# Patient Record
Sex: Female | Born: 1950 | Race: White | Hispanic: No | Marital: Married | Smoking: Never smoker
Health system: Southern US, Community
[De-identification: ages and names within clinical notes are randomized; demographics above are authoritative.]

## PROBLEM LIST (undated history)

## (undated) DIAGNOSIS — M1990 Unspecified osteoarthritis, unspecified site: Secondary | ICD-10-CM

## (undated) DIAGNOSIS — E785 Hyperlipidemia, unspecified: Secondary | ICD-10-CM

## (undated) HISTORY — DX: Unspecified osteoarthritis, unspecified site: M19.90

## (undated) HISTORY — DX: Hyperlipidemia, unspecified: E78.5

---

## 2013-07-12 LAB — HM PAP SMEAR: HM Pap smear: NEGATIVE

## 2015-02-27 LAB — HEPATIC FUNCTION PANEL
ALT: 20 U/L (ref 7–35)
AST: 19 U/L (ref 13–35)
Alkaline Phosphatase: 53 U/L (ref 25–125)
BILIRUBIN, TOTAL: 0.3 mg/dL

## 2015-02-27 LAB — BASIC METABOLIC PANEL
BUN: 15 mg/dL (ref 4–21)
Creatinine: 1.1 mg/dL (ref 0.5–1.1)
GLUCOSE: 88 mg/dL
POTASSIUM: 4.3 mmol/L (ref 3.4–5.3)
Sodium: 139 mmol/L (ref 137–147)

## 2015-02-27 LAB — LIPID PANEL
CHOLESTEROL: 224 mg/dL — AB (ref 0–200)
HDL: 55 mg/dL (ref 35–70)
LDL CALC: 140 mg/dL
TRIGLYCERIDES: 147 mg/dL (ref 40–160)

## 2015-02-27 LAB — TSH: TSH: 4.05 u[IU]/mL (ref 0.41–5.90)

## 2015-05-24 ENCOUNTER — Encounter: Payer: Self-pay | Admitting: Physician Assistant

## 2015-05-24 ENCOUNTER — Ambulatory Visit (INDEPENDENT_AMBULATORY_CARE_PROVIDER_SITE_OTHER): Payer: Medicare Other

## 2015-05-24 ENCOUNTER — Ambulatory Visit (INDEPENDENT_AMBULATORY_CARE_PROVIDER_SITE_OTHER): Payer: Medicare Other | Admitting: Physician Assistant

## 2015-05-24 VITALS — BP 116/70 | HR 86 | Wt 142.0 lb

## 2015-05-24 DIAGNOSIS — Z780 Asymptomatic menopausal state: Secondary | ICD-10-CM

## 2015-05-24 DIAGNOSIS — M85851 Other specified disorders of bone density and structure, right thigh: Secondary | ICD-10-CM | POA: Diagnosis not present

## 2015-05-24 DIAGNOSIS — Z13820 Encounter for screening for osteoporosis: Secondary | ICD-10-CM

## 2015-05-24 DIAGNOSIS — G43001 Migraine without aura, not intractable, with status migrainosus: Secondary | ICD-10-CM

## 2015-05-24 DIAGNOSIS — F32A Depression, unspecified: Secondary | ICD-10-CM

## 2015-05-24 DIAGNOSIS — E782 Mixed hyperlipidemia: Secondary | ICD-10-CM | POA: Insufficient documentation

## 2015-05-24 DIAGNOSIS — M51369 Other intervertebral disc degeneration, lumbar region without mention of lumbar back pain or lower extremity pain: Secondary | ICD-10-CM | POA: Insufficient documentation

## 2015-05-24 DIAGNOSIS — Z1231 Encounter for screening mammogram for malignant neoplasm of breast: Secondary | ICD-10-CM

## 2015-05-24 DIAGNOSIS — M818 Other osteoporosis without current pathological fracture: Secondary | ICD-10-CM | POA: Diagnosis not present

## 2015-05-24 DIAGNOSIS — M5136 Other intervertebral disc degeneration, lumbar region: Secondary | ICD-10-CM

## 2015-05-24 DIAGNOSIS — E785 Hyperlipidemia, unspecified: Secondary | ICD-10-CM

## 2015-05-24 DIAGNOSIS — F329 Major depressive disorder, single episode, unspecified: Secondary | ICD-10-CM | POA: Diagnosis not present

## 2015-05-24 DIAGNOSIS — Z8249 Family history of ischemic heart disease and other diseases of the circulatory system: Secondary | ICD-10-CM | POA: Insufficient documentation

## 2015-05-24 DIAGNOSIS — M810 Age-related osteoporosis without current pathological fracture: Secondary | ICD-10-CM | POA: Insufficient documentation

## 2015-05-24 NOTE — Patient Instructions (Signed)
Encouraged to start ASA 81mg  daily.  Encouraged vitamin D and 1500mg  of calcium daily.

## 2015-05-24 NOTE — Progress Notes (Signed)
   Subjective:    Patient ID: Kristen Morris, female    DOB: [birth year], 65 y.o.   MRN: [id number]  HPI Pt is a 65 yo female who presents to the clinic to establish care.   .. Active Ambulatory Problems    Diagnosis Date Noted  . Depression 05/24/2015  . DDD (degenerative disc disease), lumbar 05/24/2015  . Hyperlipidemia 05/24/2015  . Migraine without aura and with status migrainosus, not intractable 05/24/2015  . Post-menopausal 05/24/2015   Resolved Ambulatory Problems    Diagnosis Date Noted  . No Resolved Ambulatory Problems   No Additional Past Medical History   .[name]. Family History  Problem Relation Age of Onset  . Heart attack Father   . Cancer Brother    .[name]. Social History   Social History  . Marital Status: Married    Spouse Name: N/A  . Number of Children: N/A  . Years of Education: N/A   Occupational History  . Not on file.   Social History Main Topics  . Smoking status: Never Smoker   . Smokeless tobacco: Not on file  . Alcohol Use: No  . Drug Use: No  . Sexual Activity: Yes   Other Topics Concern  . Not on file   Social History Narrative  . No narrative on file   She does not need refills. She has never had colonoscopy. She had last labs done in [facility].    Review of Systems  All other systems reviewed and are negative.      Objective:   Physical Exam  Constitutional: She is oriented to person, place, and time. She appears well-developed and well-nourished.  HENT:  Head: Normocephalic and atraumatic.  Cardiovascular: Normal rate, regular rhythm and normal heart sounds.   Pulmonary/Chest: Effort normal and breath sounds normal.  Neurological: She is alert and oriented to person, place, and time.  Skin: Skin is dry.  Psychiatric: She has a normal mood and affect. Her behavior is normal.          Assessment & Plan:  Hyperlipidemia- on lovastatin. Will recheck in summer.   Depression- controlled on celexa. Interested in tapering  down. Ok to cut in half daily. At least stay on for 6 weeks before tapering any more. Encouraged to take at bedtime.   Lumbar DDD- controlled on diclofenac. Encouraged exercise.   Migraine- controlled with verapamil.   Post-menopausal- DEXA and mammogram ordered for screening purposes.   cologuard ordered. Pt declined colonoscopy.   Pt needs medicare wellness visit. She will schedule for the summer.

## 2015-06-01 ENCOUNTER — Other Ambulatory Visit: Payer: Self-pay | Admitting: *Deleted

## 2015-06-01 MED ORDER — CITALOPRAM HYDROBROMIDE 20 MG PO TABS: 10.0000 mg | ORAL_TABLET | Freq: Every day | ORAL | Status: DC

## 2015-06-21 DIAGNOSIS — R030 Elevated blood-pressure reading, without diagnosis of hypertension: Secondary | ICD-10-CM | POA: Diagnosis not present

## 2015-06-21 DIAGNOSIS — L237 Allergic contact dermatitis due to plants, except food: Secondary | ICD-10-CM | POA: Diagnosis not present

## 2015-06-30 ENCOUNTER — Encounter: Payer: Self-pay | Admitting: Physician Assistant

## 2015-07-19 ENCOUNTER — Encounter: Payer: Self-pay | Admitting: Physician Assistant

## 2015-08-23 ENCOUNTER — Ambulatory Visit (INDEPENDENT_AMBULATORY_CARE_PROVIDER_SITE_OTHER): Payer: Medicare Other | Admitting: Physician Assistant

## 2015-08-23 ENCOUNTER — Encounter: Payer: Self-pay | Admitting: Physician Assistant

## 2015-08-23 ENCOUNTER — Telehealth: Payer: Self-pay | Admitting: Physician Assistant

## 2015-08-23 VITALS — BP 118/78 | HR 84 | Wt 145.0 lb

## 2015-08-23 DIAGNOSIS — M810 Age-related osteoporosis without current pathological fracture: Secondary | ICD-10-CM | POA: Diagnosis not present

## 2015-08-23 DIAGNOSIS — Z0000 Encounter for general adult medical examination without abnormal findings: Secondary | ICD-10-CM | POA: Diagnosis not present

## 2015-08-23 DIAGNOSIS — R946 Abnormal results of thyroid function studies: Secondary | ICD-10-CM | POA: Diagnosis not present

## 2015-08-23 DIAGNOSIS — R7989 Other specified abnormal findings of blood chemistry: Secondary | ICD-10-CM

## 2015-08-23 DIAGNOSIS — E785 Hyperlipidemia, unspecified: Secondary | ICD-10-CM | POA: Diagnosis not present

## 2015-08-23 DIAGNOSIS — Z8349 Family history of other endocrine, nutritional and metabolic diseases: Secondary | ICD-10-CM

## 2015-08-23 DIAGNOSIS — Z131 Encounter for screening for diabetes mellitus: Secondary | ICD-10-CM

## 2015-08-23 DIAGNOSIS — Z1159 Encounter for screening for other viral diseases: Secondary | ICD-10-CM

## 2015-08-23 LAB — LIPID PANEL
CHOLESTEROL: 204 mg/dL — AB (ref 125–200)
HDL: 56 mg/dL (ref >=46)
LDL CALC: 119 mg/dL (ref <130)
TRIGLYCERIDES: 143 mg/dL (ref <150)
Total CHOL/HDL Ratio: 3.6 Ratio (ref <=5.0)
VLDL: 29 mg/dL (ref <30)

## 2015-08-23 LAB — COMPLETE METABOLIC PANEL WITH GFR
ALBUMIN: 4.2 g/dL (ref 3.6–5.1)
ALK PHOS: 48 U/L (ref 33–130)
ALT: 31 U/L — AB (ref 6–29)
AST: 31 U/L (ref 10–35)
BILIRUBIN TOTAL: 0.4 mg/dL (ref 0.2–1.2)
BUN: 19 mg/dL (ref 7–25)
CO2: 25 mmol/L (ref 20–31)
CREATININE: 1.02 mg/dL — AB (ref 0.50–0.99)
Calcium: 9.4 mg/dL (ref 8.6–10.4)
Chloride: 106 mmol/L (ref 98–110)
GFR, EST AFRICAN AMERICAN: 67 mL/min (ref >=60)
GFR, Est Non African American: 58 mL/min — ABNORMAL LOW (ref >=60)
Glucose, Bld: 87 mg/dL (ref 65–99)
Potassium: 4.5 mmol/L (ref 3.5–5.3)
Sodium: 141 mmol/L (ref 135–146)
TOTAL PROTEIN: 6.5 g/dL (ref 6.1–8.1)

## 2015-08-23 LAB — TSH: TSH: 2.8 mIU/L

## 2015-08-23 MED ORDER — CITALOPRAM HYDROBROMIDE 20 MG PO TABS: 20.0000 mg | ORAL_TABLET | Freq: Every day | ORAL | 4 refills | Status: DC

## 2015-08-23 MED ORDER — VERAPAMIL HCL 80 MG PO TABS: 80.0000 mg | ORAL_TABLET | Freq: Two times a day (BID) | ORAL | 4 refills | Status: DC

## 2015-08-23 NOTE — Progress Notes (Signed)
Subjective:    Kristen Morris is a 65 y.o. female who presents for Medicare Initial preventive examination.  Preventive Screening-Counseling & Management  Tobacco History  Smoking Status  . Never Smoker  Smokeless Tobacco  . Not on file     Problems Prior to Visit 1.   Current Problems (verified) Patient Active Problem List   Diagnosis Date Noted  . Depression 05/24/2015  . DDD (degenerative disc disease), lumbar 05/24/2015  . Hyperlipidemia 05/24/2015  . Migraine without aura and with status migrainosus, not intractable 05/24/2015  . Post-menopausal 05/24/2015  . Family history of heart attack 05/24/2015  . Osteoporosis 05/24/2015    Medications Prior to Visit Current Outpatient Prescriptions on File Prior to Visit  Medication Sig Dispense Refill  . diclofenac (VOLTAREN) 75 MG EC tablet Take 75 mg by mouth 2 (two) times daily.    [name] lovastatin (MEVACOR) 20 MG tablet Take 20 mg by mouth at bedtime.     No current facility-administered medications on file prior to visit.     Current Medications (verified) Current Outpatient Prescriptions  Medication Sig Dispense Refill  . citalopram (CELEXA) 20 MG tablet Take 1 tablet (20 mg total) by mouth daily. 90 tablet 4  . diclofenac (VOLTAREN) 75 MG EC tablet Take 75 mg by mouth 2 (two) times daily.    [name] lovastatin (MEVACOR) 20 MG tablet Take 20 mg by mouth at bedtime.    . verapamil (CALAN) 80 MG tablet Take 1 tablet (80 mg total) by mouth 2 (two) times daily. 180 tablet 4   No current facility-administered medications for this visit.      Allergies (verified) Review of patient's allergies indicates no known allergies.   PAST HISTORY  Family History Family History  Problem Relation Age of Onset  . Heart attack Father   . Cancer Brother     Social History Social History  Substance Use Topics  . Smoking status: Never Smoker  . Smokeless tobacco: Not on file  . Alcohol use No     Are there smokers in your home  (other than you)? No  Risk Factors Current exercise habits: Gym/ health club routine includes low impact aerobics, stair stepper , stationary bike, treadmill and walking on track .  Dietary issues discussed: NONE    Cardiac risk factors: advanced age (older than 58 for men, 27 for women), dyslipidemia and family history of premature cardiovascular disease.  Depression Screen (Note: if answer to either of the following is "Yes", a more complete depression screening is indicated)   Over the past 2 weeks, have you felt down, depressed or hopeless? No  Over the past 2 weeks, have you felt little interest or pleasure in doing things? No  Have you lost interest or pleasure in daily life? No  Do you often feel hopeless? No  Do you cry easily over simple problems? No  Activities of Daily Living In your present state of health, do you have any difficulty performing the following activities?:  Driving? No Managing money?  No Feeding yourself? No Getting from bed to chair? No Climbing a flight of stairs? No Preparing food and eating?: No Bathing or showering? No Getting dressed: No Getting to the toilet? No Using the toilet:No Moving around from place to place: No In the past year have you fallen or had a near fall?:No   Are you sexually active?  Yes  Do you have more than one partner?  No  Hearing Difficulties: No Do you often ask people  to speak up or repeat themselves? No Do you experience ringing or noises in your ears? No Do you have difficulty understanding soft or whispered voices? No   Do you feel that you have a problem with memory? No  Do you often misplace items? No  Do you feel safe at home?  Yes  Cognitive Testing  Alert? Yes  Normal Appearance?Yes  Oriented to person? Yes  Place? Yes   Time? Yes  Recall of three objects?  Yes  Can perform simple calculations? Yes  Displays appropriate judgment?Yes  Can read the correct time from a watch face?Yes   Advanced  Directives have been discussed with the patient? Yes  List the Names of Other Physician/Practitioners you currently use: 1.    Indicate any recent Medical Services you may have received from other than [facility] providers in the past year (date may be approximate).   There is no immunization history on file for this patient.  Screening Tests Health Maintenance  Topic Date Due  . Hepatitis C Screening  05-08-1950  . TETANUS/TDAP  04/20/1969  . Fecal DNA (Cologuard)  04/20/2000  . PNA vac Low Risk Adult (1 of 2 - PCV13) 04/21/2015  . INFLUENZA VACCINE  08/08/2015  . HIV Screening  08/22/2016 (Originally 04/20/1965)  . ZOSTAVAX  08/22/2025 (Originally 04/21/2010)  . MAMMOGRAM  05/23/2016  . PAP SMEAR  07/12/2016  . DEXA SCAN  05/23/2017    All answers were reviewed with the patient and necessary referrals were made:  Iran Planas, PA-C   08/23/2015   History reviewed: allergies, current medications, past family history, past medical history, past social history, past surgical history and problem list  Review of Systems A comprehensive review of systems was negative.    Objective:     Vision by Snellen chart: right eye:pt had eye exam in 02/2015. , left eye:pt had eye exam 02/2015  There is no height or weight on file to calculate BMI. BP 118/78   Pulse 84   Wt 145 lb (65.8 kg)   BP 118/78   Pulse 84   Wt 145 lb (65.8 kg)   General Appearance:    Alert, cooperative, no distress, appears stated age  Head:    Normocephalic, without obvious abnormality, atraumatic  Eyes:    PERRL, conjunctiva/corneas clear, EOM's intact, fundi    benign, both eyes  Ears:    Normal TM's and external ear canals, both ears  Nose:   Nares normal, septum midline, mucosa normal, no drainage    or sinus tenderness  Throat:   Lips, mucosa, and tongue normal; teeth and gums normal  Neck:   Supple, symmetrical, trachea midline, no adenopathy;    thyroid:  no enlargement/tenderness/nodules; no carotid    bruit or JVD  Back:     Symmetric, no curvature, ROM normal, no CVA tenderness  Lungs:     Clear to auscultation bilaterally, respirations unlabored  Chest Wall:    No tenderness or deformity   Heart:    Regular rate and rhythm, S1 and S2 normal, no murmur, rub   or gallop  Breast Exam:    Not done.   Abdomen:     Soft, non-tender, bowel sounds active all four quadrants,    no masses, no organomegaly  Genitalia:  NOt done.   Rectal:  Not done.   Extremities:   Extremities normal, atraumatic, no cyanosis or edema  Pulses:   2+ and symmetric all extremities  Skin:   Skin color, texture,  turgor normal, no rashes or lesions  Lymph nodes:   Cervical, supraclavicular, and axillary nodes normal  Neurologic:   CNII-XII intact, normal strength, sensation and reflexes    throughout       Assessment:     Healthy Woman Exam     Plan:     During the course of the visit the patient was educated and counseled about appropriate screening and preventive services including:    Pneumococcal vaccine   Td vaccine  Colorectal cancer screening     Pt wanted information on pneumonia vaccines. Was given today.  Tdap given today.  Will resend cologuard information since patient did not receive kit in mail.  Pt declined zostavax.   cmp ordered.   Osteoporosis- discussed prolia. Will check with insurance to get approved.   Hyperlipidemia- lipid level ordered.     Patient Instructions (the written plan) was given to the patient.  Medicare Attestation I have personally reviewed: The patient's medical and social history Their use of alcohol, tobacco or illicit drugs Their current medications and supplements The patient's functional ability including ADLs,fall risks, home safety risks, cognitive, and hearing and visual impairment Diet and physical activities Evidence for depression or mood disorders  The patient's weight, height, BMI, and visual acuity have been recorded in the chart.   I have made referrals, counseling, and provided education to the patient based on review of the above and I have provided the patient with a written personalized care plan for preventive services.     Iran Planas, PA-C   08/23/2015

## 2015-08-23 NOTE — Patient Instructions (Addendum)
Denosumab injection What is this medicine? DENOSUMAB (den oh sue mab) slows bone breakdown. Prolia is used to treat osteoporosis in women after menopause and in men. Xgeva is used to prevent bone fractures and other bone problems caused by cancer bone metastases. Xgeva is also used to treat giant cell tumor of the bone. This medicine may be used for other purposes; ask your health care provider or pharmacist if you have questions. What should I tell my health care provider before I take this medicine? They need to know if you have any of these conditions: -dental disease -eczema -infection or history of infections -kidney disease or on dialysis -low blood calcium or vitamin D -malabsorption syndrome -scheduled to have surgery or tooth extraction -taking medicine that contains denosumab -thyroid or parathyroid disease -an unusual reaction to denosumab, other medicines, foods, dyes, or preservatives -pregnant or trying to get pregnant -breast-feeding How should I use this medicine? This medicine is for injection under the skin. It is given by a health care professional in a hospital or clinic setting. If you are getting Prolia, a special MedGuide will be given to you by the pharmacist with each prescription and refill. Be sure to read this information carefully each time. For Prolia, talk to your pediatrician regarding the use of this medicine in children. Special care may be needed. For Xgeva, talk to your pediatrician regarding the use of this medicine in children. While this drug may be prescribed for children as young as 13 years for selected conditions, precautions do apply. Overdosage: If you think you have taken too much of this medicine contact a poison control center or emergency room at once. NOTE: This medicine is only for you. Do not share this medicine with others. What if I miss a dose? It is important not to miss your dose. Call your doctor or health care professional if you are  unable to keep an appointment. What may interact with this medicine? Do not take this medicine with any of the following medications: -other medicines containing denosumab This medicine may also interact with the following medications: -medicines that suppress the immune system -medicines that treat cancer -steroid medicines like prednisone or cortisone This list may not describe all possible interactions. Give your health care provider a list of all the medicines, herbs, non-prescription drugs, or dietary supplements you use. Also tell them if you smoke, drink alcohol, or use illegal drugs. Some items may interact with your medicine. What should I watch for while using this medicine? Visit your doctor or health care professional for regular checks on your progress. Your doctor or health care professional may order blood tests and other tests to see how you are doing. Call your doctor or health care professional if you get a cold or other infection while receiving this medicine. Do not treat yourself. This medicine may decrease your body's ability to fight infection. You should make sure you get enough calcium and vitamin D while you are taking this medicine, unless your doctor tells you not to. Discuss the foods you eat and the vitamins you take with your health care professional. See your dentist regularly. Brush and floss your teeth as directed. Before you have any dental work done, tell your dentist you are receiving this medicine. Do not become pregnant while taking this medicine or for 5 months after stopping it. Women should inform their doctor if they wish to become pregnant or think they might be pregnant. There is a potential for serious side effects   to an unborn child. Talk to your health care professional or pharmacist for more information. What side effects may I notice from receiving this medicine? Side effects that you should report to your doctor or health care professional as soon as  possible: -allergic reactions like skin rash, itching or hives, swelling of the face, lips, or tongue -breathing problems -chest pain -fast, irregular heartbeat -feeling faint or lightheaded, falls -fever, chills, or any other sign of infection -muscle spasms, tightening, or twitches -numbness or tingling -skin blisters or bumps, or is dry, peels, or red -slow healing or unexplained pain in the mouth or jaw -unusual bleeding or bruising Side effects that usually do not require medical attention (Report these to your doctor or health care professional if they continue or are bothersome.): -muscle pain -stomach upset, gas This list may not describe all possible side effects. Call your doctor for medical advice about side effects. You may report side effects to FDA at 1-800-FDA-1088. Where should I keep my medicine? This medicine is only given in a clinic, doctor's office, or other health care setting and will not be stored at home. NOTE: This sheet is a summary. It may not cover all possible information. If you have questions about this medicine, talk to your doctor, pharmacist, or health care provider.    2016, Elsevier/Gold Standard. (2011-06-24 12:37:47) Keeping You Healthy  Get These Tests  Blood Pressure- Have your blood pressure checked by your healthcare provider at least once a year.  Normal blood pressure is 120/80.  Weight- Have your body mass index (BMI) calculated to screen for obesity.  BMI is a measure of body fat based on height and weight.  You can calculate your own BMI at https://www.west-esparza.com/www.nhlbisupport.com/bmi/  Cholesterol- Have your cholesterol checked every year.  Diabetes- Have your blood sugar checked every year if you have high blood pressure, high cholesterol, a family history of diabetes or if you are overweight.  Pap Test - Have a pap test every 1 to 5 years if you have been sexually active.  If you are older than 65 and recent pap tests have been normal you may not need  additional pap tests.  In addition, if you have had a hysterectomy  for benign disease additional pap tests are not necessary.  Mammogram-Yearly mammograms are essential for early detection of breast cancer  Screening for Colon Cancer- Colonoscopy starting at age 65. Screening may begin sooner depending on your family history and other health conditions.  Follow up colonoscopy as directed by your Gastroenterologist.  Screening for Osteoporosis- Screening begins at age 765 with bone density scanning, sooner if you are at higher risk for developing Osteoporosis.  Get these medicines  Calcium with Vitamin D- Your body requires 1200-1500 mg of Calcium a day and [phone] IU of Vitamin D a day.  You can only absorb 500 mg of Calcium at a time therefore Calcium must be taken in 2 or 3 separate doses throughout the day.  Hormones- Hormone therapy has been associated with increased risk for certain cancers and heart disease.  Talk to your healthcare provider about if you need relief from menopausal symptoms.  Aspirin- Ask your healthcare provider about taking Aspirin to prevent Heart Disease and Stroke.  Get these Immuniztions  Flu shot- Every fall  Pneumonia shot- Once after the age of 65; if you are younger ask your healthcare provider if you need a pneumonia shot.  Tetanus- Every ten years.  Zostavax- Once after the age of 65 to  prevent shingles.  Take these steps  Don't smoke- Your healthcare provider can help you quit. For tips on how to quit, ask your healthcare provider or go to www.smokefree.gov or call 1-800 QUIT-NOW.  Be physically active- Exercise 5 days a week for a minimum of 30 minutes.  If you are not already physically active, start slow and gradually work up to 30 minutes of moderate physical activity.  Try walking, dancing, bike riding, swimming, etc.  Eat a healthy diet- Eat a variety of healthy foods such as fruits, vegetables, whole grains, low fat milk, low fat cheeses,  yogurt, lean meats, chicken, fish, eggs, dried beans, tofu, etc.  For more information go to www.thenutritionsource.org  Dental visit- Brush and floss teeth twice daily; visit your dentist twice a year.  Eye exam- Visit your Optometrist or Ophthalmologist yearly.  Drink alcohol in moderation- Limit alcohol intake to one drink or less a day.  Never drink and drive.  Depression- Your emotional health is as important as your physical health.  If you're feeling down or losing interest in things you normally enjoy, please talk to your healthcare provider.  Seat Belts- can save your life; always wear one  Smoke/Carbon Monoxide detectors- These detectors need to be installed on the appropriate level of your home.  Replace batteries at least once a year.  Violence- If anyone is threatening or hurting you, please tell your healthcare provider.  Living Will/ Health care power of attorney- Discuss with your healthcare provider and family.

## 2015-08-24 LAB — VITAMIN D 25 HYDROXY (VIT D DEFICIENCY, FRACTURES): VIT D 25 HYDROXY: 31 ng/mL (ref 30–100)

## 2015-08-24 LAB — HEPATITIS C ANTIBODY: HCV Ab: NEGATIVE

## 2015-08-24 NOTE — Telephone Encounter (Signed)
Prolia arrived, Pt scheduled. Pt does take OTC Calcium daily.

## 2015-08-24 NOTE — Telephone Encounter (Signed)
Based on Pt's insurance, no auth required. Will order Prolia for Pt and contact to schedule once it arrives.

## 2015-08-24 NOTE — Progress Notes (Signed)
Can take any tylenol products. Can she decrease to diclofenac as needed no more than once a day?

## 2015-08-28 ENCOUNTER — Ambulatory Visit (INDEPENDENT_AMBULATORY_CARE_PROVIDER_SITE_OTHER): Payer: Medicare Other | Admitting: Physician Assistant

## 2015-08-28 VITALS — BP 122/82 | HR 84 | Wt 146.0 lb

## 2015-08-28 DIAGNOSIS — M810 Age-related osteoporosis without current pathological fracture: Secondary | ICD-10-CM | POA: Insufficient documentation

## 2015-08-28 DIAGNOSIS — Z23 Encounter for immunization: Secondary | ICD-10-CM

## 2015-08-28 MED ORDER — DENOSUMAB 60 MG/ML ~~LOC~~ SOLN
60.0000 mg | Freq: Once | SUBCUTANEOUS | Status: AC
  Administered 2015-08-28: 60 mg via SUBCUTANEOUS

## 2015-08-28 NOTE — Progress Notes (Signed)
Patient was in office for Prolia Injection and she also wanted the pneumonia vaccine today. Patient had no complaints and she was advised and verbally understood  that lab needed to be rechecked  30 days prior to her getting her next injection in 6 month. Kallum Jorgensen,CMA

## 2015-10-09 ENCOUNTER — Other Ambulatory Visit: Payer: Self-pay | Admitting: Physician Assistant

## 2015-10-09 MED ORDER — LOVASTATIN 20 MG PO TABS: 20.0000 mg | ORAL_TABLET | Freq: Every day | ORAL | 0 refills | Status: DC

## 2015-10-09 MED ORDER — DICLOFENAC SODIUM 75 MG PO TBEC: 75.0000 mg | DELAYED_RELEASE_TABLET | Freq: Two times a day (BID) | ORAL | 0 refills | Status: DC

## 2015-10-09 NOTE — Telephone Encounter (Signed)
Left VM advising Pt of required OV. Callback provided for scheduling.

## 2015-10-09 NOTE — Telephone Encounter (Signed)
Needs to come in for office visit since we have never prescribed.

## 2015-11-08 ENCOUNTER — Ambulatory Visit (INDEPENDENT_AMBULATORY_CARE_PROVIDER_SITE_OTHER): Payer: Medicare Other | Admitting: Physician Assistant

## 2015-11-08 ENCOUNTER — Encounter: Payer: Self-pay | Admitting: Physician Assistant

## 2015-11-08 VITALS — BP 139/77 | HR 87 | Ht 62.0 in | Wt 146.0 lb

## 2015-11-08 DIAGNOSIS — E7800 Pure hypercholesterolemia, unspecified: Secondary | ICD-10-CM

## 2015-11-08 DIAGNOSIS — Z79899 Other long term (current) drug therapy: Secondary | ICD-10-CM | POA: Diagnosis not present

## 2015-11-08 DIAGNOSIS — M5136 Other intervertebral disc degeneration, lumbar region: Secondary | ICD-10-CM | POA: Diagnosis not present

## 2015-11-08 MED ORDER — DICLOFENAC SODIUM 75 MG PO TBEC: 75.0000 mg | DELAYED_RELEASE_TABLET | Freq: Two times a day (BID) | ORAL | 5 refills | Status: DC

## 2015-11-08 MED ORDER — LOVASTATIN 20 MG PO TABS: 20.0000 mg | ORAL_TABLET | Freq: Every day | ORAL | 4 refills | Status: DC

## 2015-11-08 NOTE — Progress Notes (Signed)
   Subjective:    Patient ID: Kristen Morris, female    DOB: 04/22/1950, 65 y.o.   MRN: [id number]  HPI Pt is a 65 yo female who presents to the clinic for medication refill. She was told by a nurse she had to come in to get refilled.   She is taking diclofenac twice a day for joint and back pain. He helps a lot. She is not having any other problems and/or side effects.   Her cholesterol was checked in 08/2015 and now she needs cholesterol refill. No problems or concerns.    Review of Systems  All other systems reviewed and are negative.      Objective:   Physical Exam  Constitutional: She is oriented to person, place, and time. She appears well-developed and well-nourished.  HENT:  Head: Normocephalic and atraumatic.  Cardiovascular: Normal rate, regular rhythm and normal heart sounds.   Pulmonary/Chest: Effort normal and breath sounds normal.  Neurological: She is alert and oriented to person, place, and time.  Psychiatric: She has a normal mood and affect. Her behavior is normal.          Assessment & Plan:  [name].[name].Diagnoses and all orders for this visit:  DDD (degenerative disc disease), lumbar -     diclofenac (VOLTAREN) 75 MG EC tablet; Take 1 tablet (75 mg total) by mouth 2 (two) times daily.  Medication management -     Basic metabolic panel  Pure hypercholesterolemia -     lovastatin (MEVACOR) 20 MG tablet; Take 1 tablet (20 mg total) by mouth at bedtime.   Serum creatine was up a little bit in august. Will check bmp to make sure creatine not trending up.

## 2015-12-04 DIAGNOSIS — Z1212 Encounter for screening for malignant neoplasm of rectum: Secondary | ICD-10-CM | POA: Diagnosis not present

## 2015-12-04 DIAGNOSIS — Z1211 Encounter for screening for malignant neoplasm of colon: Secondary | ICD-10-CM | POA: Diagnosis not present

## 2015-12-12 LAB — COLOGUARD: Cologuard: NEGATIVE

## 2015-12-19 ENCOUNTER — Telehealth: Payer: Self-pay | Admitting: *Deleted

## 2015-12-19 NOTE — Telephone Encounter (Signed)
Pt left vm asking if there is anything you can send her in for vaginal dryness.

## 2015-12-20 ENCOUNTER — Other Ambulatory Visit: Payer: Self-pay | Admitting: Physician Assistant

## 2015-12-20 MED ORDER — ESTRADIOL 0.1 MG/GM VA CREA: 1.0000 | TOPICAL_CREAM | Freq: Every day | VAGINAL | 2 refills | Status: DC

## 2015-12-20 NOTE — Telephone Encounter (Signed)
Pt.notified

## 2015-12-20 NOTE — Telephone Encounter (Signed)
I sent estrace cream one application nightly for first 2 weeks then taper down to 3 times a week. Follow up in 1 month to see how doing.

## 2015-12-20 NOTE — Progress Notes (Signed)
Pt calls in and wants something for vaginal dryness.estrace vaginal cream sent to pharmacy.  Follow up in 1-2 months to see if effective.

## 2016-01-25 ENCOUNTER — Encounter: Payer: Self-pay | Admitting: Physician Assistant

## 2016-02-26 ENCOUNTER — Other Ambulatory Visit: Payer: Self-pay | Admitting: Physician Assistant

## 2016-02-26 DIAGNOSIS — M810 Age-related osteoporosis without current pathological fracture: Secondary | ICD-10-CM

## 2016-02-28 ENCOUNTER — Ambulatory Visit: Payer: Medicare Other

## 2016-02-29 DIAGNOSIS — M810 Age-related osteoporosis without current pathological fracture: Secondary | ICD-10-CM | POA: Diagnosis not present

## 2016-03-01 LAB — COMPLETE METABOLIC PANEL WITH GFR
ALT: 34 U/L — ABNORMAL HIGH (ref 6–29)
AST: 31 U/L (ref 10–35)
Albumin: 4.1 g/dL (ref 3.6–5.1)
Alkaline Phosphatase: 32 U/L — ABNORMAL LOW (ref 33–130)
BUN: 20 mg/dL (ref 7–25)
CO2: 26 mmol/L (ref 20–31)
Calcium: 9.8 mg/dL (ref 8.6–10.4)
Chloride: 106 mmol/L (ref 98–110)
Creat: 1.07 mg/dL — ABNORMAL HIGH (ref 0.50–0.99)
GFR, Est African American: 63 mL/min (ref >=60)
GFR, Est Non African American: 55 mL/min — ABNORMAL LOW (ref >=60)
Glucose, Bld: 88 mg/dL (ref 65–99)
Potassium: 4.6 mmol/L (ref 3.5–5.3)
Sodium: 142 mmol/L (ref 135–146)
Total Bilirubin: 0.4 mg/dL (ref 0.2–1.2)
Total Protein: 6.2 g/dL (ref 6.1–8.1)

## 2016-03-02 ENCOUNTER — Encounter: Payer: Self-pay | Admitting: Family Medicine

## 2016-03-02 DIAGNOSIS — N183 Chronic kidney disease, stage 3 unspecified: Secondary | ICD-10-CM | POA: Insufficient documentation

## 2016-03-07 ENCOUNTER — Ambulatory Visit (INDEPENDENT_AMBULATORY_CARE_PROVIDER_SITE_OTHER): Payer: Medicare Other | Admitting: Family Medicine

## 2016-03-07 DIAGNOSIS — M810 Age-related osteoporosis without current pathological fracture: Secondary | ICD-10-CM

## 2016-03-07 DIAGNOSIS — Z23 Encounter for immunization: Secondary | ICD-10-CM

## 2016-03-07 MED ORDER — TETANUS-DIPHTH-ACELL PERTUSSIS 5-2.5-18.5 LF-MCG/0.5 IM SUSP
0.5000 mL | Freq: Once | INTRAMUSCULAR | Status: AC
Start: 2016-03-07 — End: 2016-03-07
  Administered 2016-03-07: 0.5 mL via INTRAMUSCULAR

## 2016-03-07 MED ORDER — DENOSUMAB 60 MG/ML ~~LOC~~ SOLN
60.0000 mg | Freq: Once | SUBCUTANEOUS | Status: AC
  Administered 2016-03-07: 60 mg via SUBCUTANEOUS

## 2016-03-07 NOTE — Progress Notes (Signed)
   Subjective:    Patient ID: Kristen Morris, female    DOB: 1950-07-10, 66 y.o.   MRN: [id number]  HPI Pt is here for a prolia injection. Pt reports taking calcium and vitamin D daily. Pt's calcium levels and kidney function was within normal limits.   Review of Systems  Respiratory: Negative for shortness of breath.   Cardiovascular: Negative for chest pain.       Objective:   Physical Exam        Assessment & Plan:  Pt tolerated injections well without any complications. Pt advised to schedule next injection in 6 months.

## 2016-04-22 ENCOUNTER — Other Ambulatory Visit: Payer: Self-pay | Admitting: Physician Assistant

## 2016-04-22 DIAGNOSIS — Z1231 Encounter for screening mammogram for malignant neoplasm of breast: Secondary | ICD-10-CM

## 2016-05-28 ENCOUNTER — Ambulatory Visit (INDEPENDENT_AMBULATORY_CARE_PROVIDER_SITE_OTHER): Payer: Medicare Other

## 2016-05-28 DIAGNOSIS — Z1231 Encounter for screening mammogram for malignant neoplasm of breast: Secondary | ICD-10-CM

## 2016-06-11 ENCOUNTER — Other Ambulatory Visit: Payer: Self-pay | Admitting: Physician Assistant

## 2016-06-11 DIAGNOSIS — M51369 Other intervertebral disc degeneration, lumbar region without mention of lumbar back pain or lower extremity pain: Secondary | ICD-10-CM

## 2016-06-11 DIAGNOSIS — M5136 Other intervertebral disc degeneration, lumbar region: Secondary | ICD-10-CM

## 2016-08-12 ENCOUNTER — Ambulatory Visit: Payer: Medicare Other | Admitting: Physician Assistant

## 2016-08-27 ENCOUNTER — Ambulatory Visit (INDEPENDENT_AMBULATORY_CARE_PROVIDER_SITE_OTHER): Payer: Medicare Other | Admitting: Physician Assistant

## 2016-08-27 ENCOUNTER — Encounter: Payer: Self-pay | Admitting: Physician Assistant

## 2016-08-27 ENCOUNTER — Ambulatory Visit (INDEPENDENT_AMBULATORY_CARE_PROVIDER_SITE_OTHER): Payer: Medicare Other

## 2016-08-27 VITALS — BP 131/87 | HR 66 | Ht 62.0 in | Wt 143.0 lb

## 2016-08-27 DIAGNOSIS — R002 Palpitations: Secondary | ICD-10-CM

## 2016-08-27 DIAGNOSIS — M19041 Primary osteoarthritis, right hand: Secondary | ICD-10-CM | POA: Diagnosis not present

## 2016-08-27 DIAGNOSIS — E7800 Pure hypercholesterolemia, unspecified: Secondary | ICD-10-CM | POA: Diagnosis not present

## 2016-08-27 DIAGNOSIS — M79641 Pain in right hand: Secondary | ICD-10-CM | POA: Diagnosis not present

## 2016-08-27 DIAGNOSIS — R42 Dizziness and giddiness: Secondary | ICD-10-CM | POA: Diagnosis not present

## 2016-08-27 DIAGNOSIS — Z8249 Family history of ischemic heart disease and other diseases of the circulatory system: Secondary | ICD-10-CM | POA: Diagnosis not present

## 2016-08-27 DIAGNOSIS — Z0000 Encounter for general adult medical examination without abnormal findings: Secondary | ICD-10-CM

## 2016-08-27 DIAGNOSIS — F331 Major depressive disorder, recurrent, moderate: Secondary | ICD-10-CM | POA: Diagnosis not present

## 2016-08-27 DIAGNOSIS — M85841 Other specified disorders of bone density and structure, right hand: Secondary | ICD-10-CM | POA: Diagnosis not present

## 2016-08-27 MED ORDER — DICLOFENAC SODIUM 1 % TD GEL: 4.0000 g | Freq: Four times a day (QID) | TRANSDERMAL | 2 refills | Status: DC

## 2016-08-27 MED ORDER — VERAPAMIL HCL 80 MG PO TABS: 80.0000 mg | ORAL_TABLET | Freq: Two times a day (BID) | ORAL | 4 refills | Status: DC

## 2016-08-27 MED ORDER — CITALOPRAM HYDROBROMIDE 20 MG PO TABS: 20.0000 mg | ORAL_TABLET | Freq: Every day | ORAL | 4 refills | Status: DC

## 2016-08-27 NOTE — Patient Instructions (Signed)
Palpitations A palpitation is the feeling that your heart:  Has an uneven (irregular) heartbeat.  Is beating faster than normal.  Is fluttering.  Is skipping a beat.  This is usually not a serious problem. In some cases, you may need more medical tests. Follow these instructions at home:  Avoid: ? Caffeine in coffee, tea, soft drinks, diet pills, and energy drinks. ? Chocolate. ? Alcohol.  Do not use any tobacco products. These include cigarettes, chewing tobacco, and e-cigarettes. If you need help quitting, ask your doctor.  Try to reduce your stress. These things may help: ? Yoga. ? Meditation. ? Physical activity. Swimming, jogging, and walking are good choices. ? A method that helps you use your mind to control things in your body, like heartbeats (biofeedback).  Get plenty of rest and sleep.  Take over-the-counter and prescription medicines only as told by your doctor.  Keep all follow-up visits as told by your doctor. This is important. Contact a doctor if:  Your heartbeat is still fast or uneven after 24 hours.  Your palpitations occur more often. Get help right away if:  You have chest pain.  You feel short of breath.  You have a very bad headache.  You feel dizzy.  You pass out (faint). This information is not intended to replace advice given to you by your health care provider. Make sure you discuss any questions you have with your health care provider. Document Released: 10/03/2007 Document Revised: 06/01/2015 Document Reviewed: 09/08/2014 Elsevier Interactive Patient Education  2018 [employer] Inc[employer]  

## 2016-08-27 NOTE — Progress Notes (Signed)
Subjective:    Patient ID: Kristen Morris, female    DOB: 11-13-1950, 66 y.o.   MRN: [id number]  HPI  Review of Systems     Objective:   Physical Exam    Subjective:   Kristen Morris is a 66 y.o. female who presents for Medicare Annual (Subsequent) preventive examination.  Pt's main concern is 2 episodes of near syncope and dizziness during heart palpitations. She has heart palpitations multiple times a day. Near syncope epsodes were linked with exertion but palpitations in general are not. Father died of MI at 55.   She also has a sharp pain in right hand and comes and goes. Denies any numbness or tingling in fingers.worse in am and better throughout the day.    Review of Systems:  See HPI>        Objective:     Vitals: BP 131/87   Pulse 66   Ht 5\' 2"  (1.575 m)   Wt 143 lb (64.9 kg)   BMI 26.16 kg/m   Body mass index is 26.16 kg/m.   Tobacco History  Smoking Status  . Never Smoker  Smokeless Tobacco  . Never Used     Counseling given: Not Answered   Past Medical History:  Diagnosis Date  . Hyperlipidemia    Past Surgical History:  Procedure Laterality Date  . CESAREAN SECTION     Family History  Problem Relation Age of Onset  . Heart attack Father   . Cancer Brother    History  Sexual Activity  . Sexual activity: Yes    Outpatient Encounter Prescriptions as of 08/27/2016  Medication Sig  . Calcium 500 MG tablet Take 600 mg by mouth.  . citalopram (CELEXA) 20 MG tablet Take 1 tablet (20 mg total) by mouth daily.  . verapamil (CALAN) 80 MG tablet Take 1 tablet (80 mg total) by mouth 2 (two) times daily.  . [DISCONTINUED] citalopram (CELEXA) 20 MG tablet Take 1 tablet (20 mg total) by mouth daily.  . [DISCONTINUED] diclofenac (VOLTAREN) 75 MG EC tablet TAKE ONE TABLET BY MOUTH TWICE DAILY  . [DISCONTINUED] lovastatin (MEVACOR) 20 MG tablet Take 1 tablet (20 mg total) by mouth at bedtime.  . [DISCONTINUED] verapamil (CALAN) 80 MG tablet  Take 1 tablet (80 mg total) by mouth 2 (two) times daily.  . diclofenac sodium (VOLTAREN) 1 % GEL Apply 4 g topically 4 (four) times daily.   No facility-administered encounter medications on file as of 08/27/2016.     Activities of Daily Living No problems.   Patient Care Team: Nolene Ebbs as PCP - General (Family Medicine)    Assessment:    NOmral female Exercise Activities and Dietary recommendations Current Exercise Habits: Home exercise routine, Type of exercise: walking, Time (Minutes): 30, Frequency (Times/Week): 4, Weekly Exercise (Minutes/Week): 120, Intensity: Mild, Exercise limited by: Other - see comments (new heart palpitations. )  Goals    None     Fall Risk Fall Risk  09/01/2016  Falls in the past year? No   Depression Screen PHQ 2/9 Scores 08/27/2016  PHQ - 2 Score 0     Cognitive Function     6CIT Screen 09/01/2016  What Year? 0 points  What month? 0 points  What time? 0 points  Count back from 20 0 points  Months in reverse 0 points  Repeat phrase 2 points  Total Score 2    Immunization History  Administered Date(s) Administered  . Pneumococcal Conjugate-13 08/28/2015  .  Tdap 03/07/2016   Screening Tests Health Maintenance  Topic Date Due  . PNA vac Low Risk Adult (2 of 2 - PPSV23) 08/27/2016  . INFLUENZA VACCINE  10/07/2017 (Originally 08/07/2016)  . DEXA SCAN  05/23/2017  . MAMMOGRAM  05/28/2017  . Fecal DNA (Cologuard)  12/12/2018  . TETANUS/TDAP  03/08/2026  . Hepatitis C Screening  Completed      Plan:      I have personally reviewed and noted the following in the patient's chart:   . Medical and social history . Use of alcohol, tobacco or illicit drugs  . Current medications and supplements . Functional ability and status . Nutritional status . Physical activity . Advanced directives . List of other physicians . Hospitalizations, surgeries, and ER visits in previous 12 months . Vitals . Screenings to include  cognitive, depression, and falls . Referrals and appointments   Kristen Morris was seen today for medicare wellness.  Diagnoses and all orders for this visit:  Heart palpitations -     EKG 12-Lead -     COMPLETE METABOLIC PANEL WITH GFR -     CBC with Differential/Platelet -     TSH -     Holter monitor - 24 hour; Future -     Exercise Tolerance Test; Future -     Holter monitor - 24 hour -     Exercise Tolerance Test  Pure hypercholesterolemia -     Lipid panel  Moderate episode of recurrent major depressive disorder (HCC) -     citalopram (CELEXA) 20 MG tablet; Take 1 tablet (20 mg total) by mouth daily.  Right hand pain -     citalopram (CELEXA) 20 MG tablet; Take 1 tablet (20 mg total) by mouth daily. -     DG Hand Complete Right; Future -     diclofenac sodium (VOLTAREN) 1 % GEL; Apply 4 g topically 4 (four) times daily. -     DG Hand Complete Right  Family history of acute myocardial infarction -     Holter monitor - 24 hour; Future -     Exercise Tolerance Test; Future -     Holter monitor - 24 hour -     Exercise Tolerance Test  Dizziness -     Holter monitor - 24 hour; Future -     Exercise Tolerance Test; Future -     Holter monitor - 24 hour -     Exercise Tolerance Test  Other orders -     verapamil (CALAN) 80 MG tablet; Take 1 tablet (80 mg total) by mouth 2 (two) times daily.  Refilled celexa.   Right hand xray showed osteopenia and diffuse degenerative changes. Use volatren gel. If not improving consider sports medicine for injection.   EKG- sinus rhythm with 1st AV block. Unchanged since last 2 EKGs.   Due to  symptoms, and family history will order holter monitor and exercise stress test.  Given HO on palpitations and likely PVC's. Pt already on CCB for migraines may need to consider BB.    Pt declined the flu shot today.   In addition, I have reviewed and discussed with patient certain preventive protocols, quality metrics, and best practice  recommendations. A written personalized care plan for preventive services as well as general preventive health recommendations were provided to patient.     Kristen Gaw, PA-C

## 2016-08-28 ENCOUNTER — Telehealth: Payer: Self-pay

## 2016-08-28 DIAGNOSIS — E7800 Pure hypercholesterolemia, unspecified: Secondary | ICD-10-CM | POA: Diagnosis not present

## 2016-08-28 DIAGNOSIS — R002 Palpitations: Secondary | ICD-10-CM | POA: Diagnosis not present

## 2016-08-28 LAB — CBC WITH DIFFERENTIAL/PLATELET
BASOS ABS: 52 {cells}/uL (ref 0–200)
BASOS PCT: 1 %
EOS ABS: 156 {cells}/uL (ref 15–500)
Eosinophils Relative: 3 %
HEMATOCRIT: 38.4 % (ref 35.0–45.0)
Hemoglobin: 12.3 g/dL (ref 11.7–15.5)
LYMPHS PCT: 32 %
Lymphs Abs: 1664 cells/uL (ref 850–3900)
MCH: 30.7 pg (ref 27.0–33.0)
MCHC: 32 g/dL (ref 32.0–36.0)
MCV: 95.8 fL (ref 80.0–100.0)
MONO ABS: 520 {cells}/uL (ref 200–950)
MPV: 9.2 fL (ref 7.5–12.5)
Monocytes Relative: 10 %
Neutro Abs: 2808 cells/uL (ref 1500–7800)
Neutrophils Relative %: 54 %
Platelets: 344 10*3/uL (ref 140–400)
RBC: 4.01 MIL/uL (ref 3.80–5.10)
RDW: 12.7 % (ref 11.0–15.0)
WBC: 5.2 10*3/uL (ref 3.8–10.8)

## 2016-08-28 LAB — TSH: TSH: 3.4 mIU/L

## 2016-08-28 NOTE — Telephone Encounter (Signed)
Pt returned call regarding her imaging results. Pt was informed of imaging results. Pt expressed understanding and is agreeable. Liborio Nixon CMA, RT

## 2016-08-28 NOTE — Progress Notes (Signed)
Call pt: low bone mass with lots of osteoarthritis. I would consider injection with  sports medicine if symptoms do not improve with NSAIDs and periodical rest.

## 2016-08-29 LAB — COMPLETE METABOLIC PANEL WITH GFR
ALT: 25 U/L (ref 6–29)
AST: 26 U/L (ref 10–35)
Albumin: 3.9 g/dL (ref 3.6–5.1)
Alkaline Phosphatase: 35 U/L (ref 33–130)
BUN: 19 mg/dL (ref 7–25)
CHLORIDE: 107 mmol/L (ref 98–110)
CO2: 23 mmol/L (ref 20–32)
CREATININE: 0.96 mg/dL (ref 0.50–0.99)
Calcium: 9.4 mg/dL (ref 8.6–10.4)
GFR, EST AFRICAN AMERICAN: 71 mL/min (ref >=60)
GFR, Est Non African American: 62 mL/min (ref >=60)
GLUCOSE: 84 mg/dL (ref 65–99)
Potassium: 4.3 mmol/L (ref 3.5–5.3)
SODIUM: 142 mmol/L (ref 135–146)
Total Bilirubin: 0.4 mg/dL (ref 0.2–1.2)
Total Protein: 6 g/dL — ABNORMAL LOW (ref 6.1–8.1)

## 2016-08-29 LAB — LIPID PANEL
Cholesterol: 194 mg/dL (ref <200)
HDL: 54 mg/dL (ref >50)
LDL CALC: 115 mg/dL — AB (ref <100)
Total CHOL/HDL Ratio: 3.6 Ratio (ref <5.0)
Triglycerides: 125 mg/dL (ref <150)
VLDL: 25 mg/dL (ref <30)

## 2016-08-30 ENCOUNTER — Other Ambulatory Visit: Payer: Self-pay | Admitting: *Deleted

## 2016-08-30 DIAGNOSIS — E7800 Pure hypercholesterolemia, unspecified: Secondary | ICD-10-CM

## 2016-08-30 MED ORDER — LOVASTATIN 20 MG PO TABS: 20.0000 mg | ORAL_TABLET | Freq: Every day | ORAL | 4 refills | Status: DC

## 2016-08-30 NOTE — Progress Notes (Signed)
Closed

## 2016-09-01 DIAGNOSIS — M79641 Pain in right hand: Secondary | ICD-10-CM | POA: Insufficient documentation

## 2016-09-01 DIAGNOSIS — R002 Palpitations: Secondary | ICD-10-CM | POA: Insufficient documentation

## 2016-09-26 ENCOUNTER — Ambulatory Visit (INDEPENDENT_AMBULATORY_CARE_PROVIDER_SITE_OTHER): Payer: Medicare Other

## 2016-09-26 DIAGNOSIS — R42 Dizziness and giddiness: Secondary | ICD-10-CM | POA: Diagnosis not present

## 2016-09-26 DIAGNOSIS — R002 Palpitations: Secondary | ICD-10-CM | POA: Diagnosis not present

## 2016-09-26 DIAGNOSIS — Z8249 Family history of ischemic heart disease and other diseases of the circulatory system: Secondary | ICD-10-CM

## 2016-09-26 LAB — EXERCISE TOLERANCE TEST
CHL CUP RESTING HR STRESS: 68 {beats}/min
CSEPEDS: 0 s
CSEPEW: 8.5 METS
CSEPPHR: 153 {beats}/min
Exercise duration (min): 7 min
MPHR: 154 {beats}/min
Percent HR: 99 %
RPE: 17

## 2016-12-04 ENCOUNTER — Telehealth: Payer: Self-pay | Admitting: *Deleted

## 2016-12-04 DIAGNOSIS — M810 Age-related osteoporosis without current pathological fracture: Secondary | ICD-10-CM

## 2016-12-04 NOTE — Telephone Encounter (Signed)
CMP ordered for Prolia injection. 

## 2016-12-05 DIAGNOSIS — M810 Age-related osteoporosis without current pathological fracture: Secondary | ICD-10-CM | POA: Diagnosis not present

## 2016-12-05 LAB — COMPLETE METABOLIC PANEL WITH GFR
AG Ratio: 1.8 (calc) (ref 1.0–2.5)
ALBUMIN MSPROF: 3.9 g/dL (ref 3.6–5.1)
ALKALINE PHOSPHATASE (APISO): 41 U/L (ref 33–130)
ALT: 22 U/L (ref 6–29)
AST: 22 U/L (ref 10–35)
BUN / CREAT RATIO: 22 (calc) (ref 6–22)
BUN: 24 mg/dL (ref 7–25)
CALCIUM: 9.3 mg/dL (ref 8.6–10.4)
CO2: 26 mmol/L (ref 20–32)
CREATININE: 1.11 mg/dL — AB (ref 0.50–0.99)
Chloride: 108 mmol/L (ref 98–110)
GFR, EST AFRICAN AMERICAN: 60 mL/min/{1.73_m2} (ref >=60)
GFR, EST NON AFRICAN AMERICAN: 52 mL/min/{1.73_m2} — AB (ref >=60)
GLOBULIN: 2.2 g/dL (ref 1.9–3.7)
Glucose, Bld: 88 mg/dL (ref 65–99)
Potassium: 4.1 mmol/L (ref 3.5–5.3)
SODIUM: 140 mmol/L (ref 135–146)
TOTAL PROTEIN: 6.1 g/dL (ref 6.1–8.1)
Total Bilirubin: 0.4 mg/dL (ref 0.2–1.2)

## 2016-12-09 ENCOUNTER — Ambulatory Visit (INDEPENDENT_AMBULATORY_CARE_PROVIDER_SITE_OTHER): Payer: Medicare Other | Admitting: Physician Assistant

## 2016-12-09 VITALS — BP 130/84 | HR 72 | Wt 150.0 lb

## 2016-12-09 DIAGNOSIS — M818 Other osteoporosis without current pathological fracture: Secondary | ICD-10-CM | POA: Diagnosis not present

## 2016-12-09 MED ORDER — DENOSUMAB 60 MG/ML ~~LOC~~ SOLN
60.0000 mg | Freq: Once | SUBCUTANEOUS | Status: AC
  Administered 2016-12-09: 60 mg via SUBCUTANEOUS

## 2016-12-09 NOTE — Progress Notes (Signed)
   Subjective:    Patient ID: Kristen Morris, female    DOB: [birth year], 66 y.o.   MRN: [id number]  HPI  [name] is here for a Prolia injection. She denies any problems with the injection in the past. She is taking calcium.   Review of Systems     Objective:   Physical Exam        Assessment & Plan:  Osteoporosis - Patient tolerated injection well without complications. Patient advised to schedule next injection 6 months from today.   Agree with above plan. Tandy GawJade Breeback PA-C

## 2017-04-21 ENCOUNTER — Other Ambulatory Visit: Payer: Self-pay | Admitting: Physician Assistant

## 2017-04-21 DIAGNOSIS — Z1231 Encounter for screening mammogram for malignant neoplasm of breast: Secondary | ICD-10-CM

## 2017-05-30 ENCOUNTER — Ambulatory Visit (INDEPENDENT_AMBULATORY_CARE_PROVIDER_SITE_OTHER): Payer: Medicare Other

## 2017-05-30 DIAGNOSIS — Z1231 Encounter for screening mammogram for malignant neoplasm of breast: Secondary | ICD-10-CM | POA: Diagnosis not present

## 2017-05-30 NOTE — Progress Notes (Signed)
Call pt: normal mammogram. Follow up 1 year.

## 2017-06-06 ENCOUNTER — Other Ambulatory Visit: Payer: Self-pay | Admitting: Physician Assistant

## 2017-06-06 DIAGNOSIS — M810 Age-related osteoporosis without current pathological fracture: Secondary | ICD-10-CM

## 2017-06-06 NOTE — Progress Notes (Signed)
Lab ordered for Prolia injection.

## 2017-06-09 ENCOUNTER — Ambulatory Visit: Payer: Medicare Other

## 2017-06-09 DIAGNOSIS — M810 Age-related osteoporosis without current pathological fracture: Secondary | ICD-10-CM | POA: Diagnosis not present

## 2017-06-09 LAB — COMPLETE METABOLIC PANEL WITH GFR
AG Ratio: 1.9 (calc) (ref 1.0–2.5)
ALBUMIN MSPROF: 4 g/dL (ref 3.6–5.1)
ALKALINE PHOSPHATASE (APISO): 36 U/L (ref 33–130)
ALT: 24 U/L (ref 6–29)
AST: 26 U/L (ref 10–35)
BILIRUBIN TOTAL: 0.4 mg/dL (ref 0.2–1.2)
BUN: 12 mg/dL (ref 7–25)
CHLORIDE: 107 mmol/L (ref 98–110)
CO2: 28 mmol/L (ref 20–32)
Calcium: 9.5 mg/dL (ref 8.6–10.4)
Creat: 0.95 mg/dL (ref 0.50–0.99)
GFR, Est African American: 72 mL/min/{1.73_m2} (ref >=60)
GFR, Est Non African American: 62 mL/min/{1.73_m2} (ref >=60)
GLUCOSE: 82 mg/dL (ref 65–99)
Globulin: 2.1 g/dL (calc) (ref 1.9–3.7)
Potassium: 4.2 mmol/L (ref 3.5–5.3)
Sodium: 142 mmol/L (ref 135–146)
Total Protein: 6.1 g/dL (ref 6.1–8.1)

## 2017-06-09 NOTE — Progress Notes (Signed)
Call pt: kidney function is normal. Great news. Labs look much better.

## 2017-06-10 ENCOUNTER — Ambulatory Visit (INDEPENDENT_AMBULATORY_CARE_PROVIDER_SITE_OTHER): Payer: Medicare Other | Admitting: Physician Assistant

## 2017-06-10 VITALS — BP 119/74 | HR 66 | Ht 62.0 in | Wt 144.0 lb

## 2017-06-10 DIAGNOSIS — M810 Age-related osteoporosis without current pathological fracture: Secondary | ICD-10-CM | POA: Diagnosis not present

## 2017-06-10 MED ORDER — DENOSUMAB 60 MG/ML ~~LOC~~ SOSY
60.0000 mg | PREFILLED_SYRINGE | Freq: Once | SUBCUTANEOUS | Status: AC
  Administered 2017-06-10: 60 mg via SUBCUTANEOUS

## 2017-06-10 MED ORDER — CLOBETASOL PROPIONATE 0.05 % EX CREA: 1.0000 "application " | TOPICAL_CREAM | Freq: Two times a day (BID) | CUTANEOUS | 2 refills | Status: DC

## 2017-06-10 NOTE — Progress Notes (Signed)
   Subjective:    Patient ID: Kristen Morris, female    DOB: 07-14-1950, 67 y.o.   MRN: [id number]  HPI Pt is here for Prolia injection. Pt reports taking calcium daily. Patients calcium and kidney function are within the normal limits.  Pt has been using a cream she got from her cousin called Clobetasol Propionate USP 0.05% for her hands that are cracking and area on right foot and it is helping and wanted to know if you would send this to her pharmacy.KG LPN   Review of Systems     Objective:   Physical Exam        Assessment & Plan:  Patient tolerated injection well without any complications. Pt advised to schedule next injection in 6 months. KG LPN  Agree with the above plan. Tandy GawJade Breeback PA-C

## 2017-08-11 ENCOUNTER — Telehealth: Payer: Self-pay | Admitting: Physician Assistant

## 2017-08-11 DIAGNOSIS — M810 Age-related osteoporosis without current pathological fracture: Secondary | ICD-10-CM

## 2017-08-11 DIAGNOSIS — Z0000 Encounter for general adult medical examination without abnormal findings: Secondary | ICD-10-CM

## 2017-08-11 DIAGNOSIS — E7800 Pure hypercholesterolemia, unspecified: Secondary | ICD-10-CM

## 2017-08-11 DIAGNOSIS — Z8249 Family history of ischemic heart disease and other diseases of the circulatory system: Secondary | ICD-10-CM

## 2017-08-11 NOTE — Telephone Encounter (Signed)
Pt scheduled her CPE for 8/27 and would like for you to send her lab order downstairs so she can get labs before her visit. Thanks

## 2017-08-12 NOTE — Telephone Encounter (Signed)
Labs ordered  Pt advised

## 2017-08-12 NOTE — Telephone Encounter (Signed)
Orders look good to me! Thanks!

## 2017-08-29 DIAGNOSIS — E7800 Pure hypercholesterolemia, unspecified: Secondary | ICD-10-CM | POA: Diagnosis not present

## 2017-08-29 DIAGNOSIS — M810 Age-related osteoporosis without current pathological fracture: Secondary | ICD-10-CM | POA: Diagnosis not present

## 2017-08-29 DIAGNOSIS — Z0000 Encounter for general adult medical examination without abnormal findings: Secondary | ICD-10-CM | POA: Diagnosis not present

## 2017-08-29 DIAGNOSIS — Z8249 Family history of ischemic heart disease and other diseases of the circulatory system: Secondary | ICD-10-CM | POA: Diagnosis not present

## 2017-08-30 LAB — CBC
HCT: 37.5 % (ref 35.0–45.0)
Hemoglobin: 12.6 g/dL (ref 11.7–15.5)
MCH: 31.2 pg (ref 27.0–33.0)
MCHC: 33.6 g/dL (ref 32.0–36.0)
MCV: 92.8 fL (ref 80.0–100.0)
MPV: 10.2 fL (ref 7.5–12.5)
PLATELETS: 296 10*3/uL (ref 140–400)
RBC: 4.04 10*6/uL (ref 3.80–5.10)
RDW: 11.7 % (ref 11.0–15.0)
WBC: 4.6 10*3/uL (ref 3.8–10.8)

## 2017-08-30 LAB — COMPLETE METABOLIC PANEL WITH GFR
AG RATIO: 1.9 (calc) (ref 1.0–2.5)
ALKALINE PHOSPHATASE (APISO): 35 U/L (ref 33–130)
ALT: 20 U/L (ref 6–29)
AST: 23 U/L (ref 10–35)
Albumin: 4.1 g/dL (ref 3.6–5.1)
BUN: 19 mg/dL (ref 7–25)
CO2: 22 mmol/L (ref 20–32)
Calcium: 8.8 mg/dL (ref 8.6–10.4)
Chloride: 111 mmol/L — ABNORMAL HIGH (ref 98–110)
Creat: 0.96 mg/dL (ref 0.50–0.99)
GFR, Est African American: 71 mL/min/{1.73_m2} (ref >=60)
GFR, Est Non African American: 61 mL/min/{1.73_m2} (ref >=60)
GLOBULIN: 2.2 g/dL (ref 1.9–3.7)
Glucose, Bld: 92 mg/dL (ref 65–99)
POTASSIUM: 4.1 mmol/L (ref 3.5–5.3)
SODIUM: 142 mmol/L (ref 135–146)
Total Bilirubin: 0.4 mg/dL (ref 0.2–1.2)
Total Protein: 6.3 g/dL (ref 6.1–8.1)

## 2017-08-30 LAB — LIPID PANEL
CHOLESTEROL: 190 mg/dL (ref <200)
HDL: 53 mg/dL (ref >50)
LDL Cholesterol (Calc): 117 mg/dL (calc) — ABNORMAL HIGH
Non-HDL Cholesterol (Calc): 137 mg/dL (calc) — ABNORMAL HIGH (ref <130)
TRIGLYCERIDES: 101 mg/dL (ref <150)
Total CHOL/HDL Ratio: 3.6 (calc) (ref <5.0)

## 2017-08-30 LAB — TSH: TSH: 3.98 m[IU]/L (ref 0.40–4.50)

## 2017-08-31 NOTE — Telephone Encounter (Signed)
Will go over at CPE in a few days.

## 2017-09-02 ENCOUNTER — Ambulatory Visit (INDEPENDENT_AMBULATORY_CARE_PROVIDER_SITE_OTHER): Payer: Medicare Other | Admitting: Physician Assistant

## 2017-09-02 ENCOUNTER — Encounter: Payer: Self-pay | Admitting: Physician Assistant

## 2017-09-02 VITALS — BP 137/83 | HR 81 | Ht 62.0 in | Wt 143.0 lb

## 2017-09-02 DIAGNOSIS — Z0000 Encounter for general adult medical examination without abnormal findings: Secondary | ICD-10-CM | POA: Diagnosis not present

## 2017-09-02 DIAGNOSIS — M549 Dorsalgia, unspecified: Secondary | ICD-10-CM

## 2017-09-02 DIAGNOSIS — Z23 Encounter for immunization: Secondary | ICD-10-CM

## 2017-09-02 DIAGNOSIS — M545 Low back pain, unspecified: Secondary | ICD-10-CM

## 2017-09-02 DIAGNOSIS — E782 Mixed hyperlipidemia: Secondary | ICD-10-CM

## 2017-09-02 DIAGNOSIS — R1032 Left lower quadrant pain: Secondary | ICD-10-CM

## 2017-09-02 DIAGNOSIS — G43001 Migraine without aura, not intractable, with status migrainosus: Secondary | ICD-10-CM | POA: Diagnosis not present

## 2017-09-02 DIAGNOSIS — F331 Major depressive disorder, recurrent, moderate: Secondary | ICD-10-CM

## 2017-09-02 MED ORDER — MELOXICAM 15 MG PO TABS: 15.0000 mg | ORAL_TABLET | Freq: Every day | ORAL | 1 refills | Status: DC

## 2017-09-02 MED ORDER — CITALOPRAM HYDROBROMIDE 20 MG PO TABS: 20.0000 mg | ORAL_TABLET | Freq: Every day | ORAL | 4 refills | Status: DC

## 2017-09-02 MED ORDER — VERAPAMIL HCL 80 MG PO TABS: 80.0000 mg | ORAL_TABLET | Freq: Two times a day (BID) | ORAL | 4 refills | Status: DC

## 2017-09-02 MED ORDER — PREDNISONE 50 MG PO TABS: ORAL_TABLET | ORAL | 0 refills | Status: DC

## 2017-09-02 MED ORDER — LOVASTATIN 20 MG PO TABS: 20.0000 mg | ORAL_TABLET | Freq: Every day | ORAL | 4 refills | Status: DC

## 2017-09-02 NOTE — Patient Instructions (Addendum)

## 2017-09-02 NOTE — Progress Notes (Signed)
l Subjective:     Kristen Morris is a 67 y.o. female and is here for a comprehensive physical exam. The patient reports problems - right sided mid to low back pain for last 3 months. at times rates pain 8/10. denies any trauma. no radiation into legs or buttocks. no bowel or bladder dysfunction. no saddle anesthesia. she is also having intermittent LLQ pain. "feels like an ovary cramping".  BC powders take edge off pain.   Social History   Socioeconomic History  . Marital status: Married    Spouse name: Not on file  . Number of children: Not on file  . Years of education: Not on file  . Highest education level: Not on file  Occupational History  . Not on file  Social Needs  . Financial resource strain: Not on file  . Food insecurity:    Worry: Not on file    Inability: Not on file  . Transportation needs:    Medical: Not on file    Non-medical: Not on file  Tobacco Use  . Smoking status: Never Smoker  . Smokeless tobacco: Never Used  Substance and Sexual Activity  . Alcohol use: No    Alcohol/week: 0.0 standard drinks  . Drug use: No  . Sexual activity: Yes  Lifestyle  . Physical activity:    Days per week: Not on file    Minutes per session: Not on file  . Stress: Not on file  Relationships  . Social connections:    Talks on phone: Not on file    Gets together: Not on file    Attends religious service: Not on file    Active member of club or organization: Not on file    Attends meetings of clubs or organizations: Not on file    Relationship status: Not on file  . Intimate partner violence:    Fear of current or ex partner: Not on file    Emotionally abused: Not on file    Physically abused: Not on file    Forced sexual activity: Not on file  Other Topics Concern  . Not on file  Social History Narrative  . Not on file   Health Maintenance  Topic Date Due  . DEXA SCAN  09/03/2018 (Originally 05/23/2017)  . MAMMOGRAM  05/31/2018  . Fecal DNA (Cologuard)   12/12/2018  . TETANUS/TDAP  03/08/2026  . INFLUENZA VACCINE  Completed  . Hepatitis C Screening  Completed  . PNA vac Low Risk Adult  Completed    The following portions of the patient's history were reviewed and updated as appropriate: allergies, current medications, past family history, past medical history, past social history, past surgical history and problem list.  Review of Systems Pertinent items noted in HPI and remainder of comprehensive ROS otherwise negative.   Objective:    BP 137/83   Pulse 81   Ht 5\' 2"  (1.575 m)   Wt 143 lb (64.9 kg)   BMI 26.16 kg/m  General appearance: alert, cooperative and appears stated age Head: Normocephalic, without obvious abnormality, atraumatic Eyes: conjunctivae/corneas clear. PERRL, EOM's intact. Fundi benign. Ears: normal TM's and external ear canals both ears Nose: Nares normal. Septum midline. Mucosa normal. No drainage or sinus tenderness. Throat: lips, mucosa, and tongue normal; teeth and gums normal Neck: no adenopathy, no carotid bruit, no JVD, supple, symmetrical, trachea midline and thyroid not enlarged, symmetric, no tenderness/mass/nodules Back: no CVA tenderness. Tightness and tenderness to palpation down right mid to low back. no  thoracic or lumbar spine tenderness. Negative straight leg raise. NROM at waist.  Lungs: clear to auscultation bilaterally Heart: regular rate and rhythm, S1, S2 normal, no murmur, click, rub or gallop Abdomen: soft, non-tender; bowel sounds normal; no masses,  no organomegaly and could not repeat tenderness in left lower quadrant Extremities: extremities normal, atraumatic, no cyanosis or edema Pulses: 2+ and symmetric Skin: Skin color, texture, turgor normal. No rashes or lesions Lymph nodes: Cervical, supraclavicular, and axillary nodes normal. Neurologic: Alert and oriented X 3, normal strength and tone. Normal symmetric reflexes. Normal coordination and gait    Assessment:    Healthy  female exam.      Plan:  .[name] was seen today for annual exam.  Diagnoses and all orders for this visit:  Routine physical examination  Need for immunization against influenza -     Flu Vaccine QUAD 36+ mos IM  Moderate episode of recurrent major depressive disorder (HCC) -     citalopram (CELEXA) 20 MG tablet; Take 1 tablet (20 mg total) by mouth daily.  Mixed hyperlipidemia -     lovastatin (MEVACOR) 20 MG tablet; Take 1 tablet (20 mg total) by mouth at bedtime.  Acute right-sided low back pain without sciatica -     predniSONE (DELTASONE) 50 MG tablet; One tab PO daily for 5 days. -     DG Thoracic Spine W/Swimmers -     DG Lumbar Spine Complete -     meloxicam (MOBIC) 15 MG tablet; Take 1 tablet (15 mg total) by mouth daily.  Mid back pain -     predniSONE (DELTASONE) 50 MG tablet; One tab PO daily for 5 days. -     DG Thoracic Spine W/Swimmers -     DG Lumbar Spine Complete -     meloxicam (MOBIC) 15 MG tablet; Take 1 tablet (15 mg total) by mouth daily.  Need for pneumococcal vaccination -     Pneumococcal polysaccharide vaccine 23-valent greater than or equal to 2yo subcutaneous/IM  Left lower quadrant pain -     meloxicam (MOBIC) 15 MG tablet; Take 1 tablet (15 mg total) by mouth daily. -     US PELVIS (TRANSABDOMINAL ONLY)  Migraine without aura and with status migrainosus, not intractable -     verapamil (CALAN) 80 MG tablet; Take 1 tablet (80 mg total) by mouth 2 (two) times daily.   .. Depression screen [facility] 2/9 09/02/2017 08/27/2016  Decreased Interest 0 0  Down, Depressed, Hopeless 0 0  PHQ - 2 Score 0 0  Altered sleeping 2 -  Tired, decreased energy 1 -  Change in appetite 0 -  Feeling bad or failure about yourself  0 -  Trouble concentrating 0 -  Moving slowly or fidgety/restless 0 -  Suicidal thoughts 0 -  PHQ-9 Score 3 -  Difficult doing work/chores Somewhat difficult -   .. GAD 7 : Generalized Anxiety Score 09/02/2017  Nervous, Anxious, on  Edge 0  Control/stop worrying 1  Worry too much - different things 0  Trouble relaxing 0  Restless 0  Easily annoyed or irritable 0  Afraid - awful might happen 0  Total GAD 7 Score 1  Anxiety Difficulty Not difficult at all   .[name] Discussed 150 minutes of exercise a week.  Encouraged vitamin D 1000 units and Calcium 1300mg  or 4 servings of dairy a day.  Vaccines discussed. Encouraged to get shingles vaccines.  Mammogram/colonoscopy up to date.  Medications refilled today.  Fasting  labs done and looks great. Discussed today in office.   Will start work up of back pain with lumbar/thoracic xray. consistent with muscle spasms. Prednisone burst. mobic to start for next 2 weeks. Discussed heat and biofreeze. Will start PT.   Unclear etiology of intermittent LLQ pain. Could be stool moving through. Will get abdominal u/s. Will continue to monitor.      See After Visit Summary for Counseling Recommendations

## 2017-09-03 ENCOUNTER — Encounter: Payer: Self-pay | Admitting: Physician Assistant

## 2017-09-03 ENCOUNTER — Other Ambulatory Visit: Payer: Self-pay | Admitting: Physician Assistant

## 2017-09-03 DIAGNOSIS — M549 Dorsalgia, unspecified: Secondary | ICD-10-CM | POA: Insufficient documentation

## 2017-09-03 DIAGNOSIS — M545 Low back pain, unspecified: Secondary | ICD-10-CM | POA: Insufficient documentation

## 2017-09-03 DIAGNOSIS — R1032 Left lower quadrant pain: Secondary | ICD-10-CM | POA: Insufficient documentation

## 2017-09-05 ENCOUNTER — Ambulatory Visit (INDEPENDENT_AMBULATORY_CARE_PROVIDER_SITE_OTHER): Payer: Medicare Other

## 2017-09-05 ENCOUNTER — Encounter: Payer: Self-pay | Admitting: Physician Assistant

## 2017-09-05 DIAGNOSIS — M5136 Other intervertebral disc degeneration, lumbar region: Secondary | ICD-10-CM

## 2017-09-05 DIAGNOSIS — M5134 Other intervertebral disc degeneration, thoracic region: Secondary | ICD-10-CM

## 2017-09-05 DIAGNOSIS — M4804 Spinal stenosis, thoracic region: Secondary | ICD-10-CM | POA: Diagnosis not present

## 2017-09-05 DIAGNOSIS — N888 Other specified noninflammatory disorders of cervix uteri: Secondary | ICD-10-CM

## 2017-09-05 DIAGNOSIS — R1032 Left lower quadrant pain: Secondary | ICD-10-CM

## 2017-09-05 DIAGNOSIS — M48061 Spinal stenosis, lumbar region without neurogenic claudication: Secondary | ICD-10-CM | POA: Diagnosis not present

## 2017-09-05 NOTE — Progress Notes (Signed)
Call pt: no abnormality from u/s to correlate with area of pain. Did see a cyst on cervix looks completely normal but suggest follow up of stability in 3 months with another u/s.

## 2017-09-05 NOTE — Progress Notes (Signed)
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 This weakening of spine could causes muscle to tighten and become sensitive. Order placed for PT start this and lets see how symptoms improve.

## 2017-09-10 ENCOUNTER — Ambulatory Visit (INDEPENDENT_AMBULATORY_CARE_PROVIDER_SITE_OTHER): Payer: Medicare Other | Admitting: Rehabilitative and Restorative Service Providers"

## 2017-09-10 ENCOUNTER — Encounter: Payer: Self-pay | Admitting: Rehabilitative and Restorative Service Providers"

## 2017-09-10 DIAGNOSIS — M546 Pain in thoracic spine: Secondary | ICD-10-CM

## 2017-09-10 DIAGNOSIS — R29898 Other symptoms and signs involving the musculoskeletal system: Secondary | ICD-10-CM

## 2017-09-10 DIAGNOSIS — M545 Low back pain, unspecified: Secondary | ICD-10-CM

## 2017-09-10 NOTE — Therapy (Addendum)
[facility] [place] [address]  [address] [place], [place], [address] Phone: [phone]   Fax:  [phone]  Physical Therapy Evaluation  Patient Details  Name: Suraya Vidrine MRN: [id number] Date of Birth: 10/06/1950 Referring Provider: Tandy Gaw PA-C   Encounter Date: 09/10/2017  PT End of Session - 09/10/17 0947    Visit Number  1    Number of Visits  12    Date for PT Re-Evaluation  10/17/17    PT Start Time  0848    PT Stop Time  0953    PT Time Calculation (min)  65 min    Activity Tolerance  Patient tolerated treatment well       Past Medical History:  Diagnosis Date  . Hyperlipidemia     Past Surgical History:  Procedure Laterality Date  . CESAREAN SECTION      There were no vitals filed for this visit.   Subjective Assessment - 09/10/17 0851    Subjective  Patient reports gradual onset of mid to low back pain which is now severe. She does not have any injury that she remembers. Denies prior back pain     Pertinent History  unremarkable per pt report     Diagnostic tests  MRI - DDD lumbar spine     Patient Stated Goals  get rid of the back pain     Currently in Pain?  Yes    Pain Score  8     Pain Location  Back    Pain Orientation  Right;Left;Mid;Lower    Pain Descriptors / Indicators  Tightness;Aching;Burning    Pain Type  Acute pain    Pain Radiating Towards  internally     Pain Onset  More than a month ago    Pain Frequency  Constant    Pain Relieving Factors  heating pad - some help          [facility] PT Assessment - 09/10/17 0001      Assessment   Medical Diagnosis  Mid to low back pain     Referring Provider  Tandy Gaw PA-C    Onset Date/Surgical Date  06/07/17    Hand Dominance  Right    Next MD Visit  not scheduled     Prior Therapy  none      Precautions   Precautions  None      Balance Screen   Has the patient fallen in the past 6 months  No    Has the patient had a decrease in  activity level because of a fear of falling?   No    Is the patient reluctant to leave their home because of a fear of falling?   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    Leisure  household chores; walking puppy 3 times/day ~ 4 miles total; new grandbaby; sewing; meals on wheels; mission work Thursdays       Observation/Other Assessments   Focus on Therapeutic Outcomes (FOTO)   37% limitation       Sensation   Additional Comments  Lt arm numbness on an intermittent basis into ring and littel fingers       Posture/Postural Control   Posture Comments  head forward shoulders rounded; scapulae abducted and rotated along the thoracic spine  AROM   Lumbar Flexion  85%    Lumbar Extension  70%    Lumbar - Right Side Bend  80%    Lumbar - Left Side Bend  80%    Lumbar - Right Rotation  80%    Lumbar - Left Rotation  80%      Strength   Overall Strength Comments  5/5 bilat LE's       Flexibility   Hamstrings  tight bilat ~ 75-80 deg    Quadriceps  WFL's     ITB  WFL's     Piriformis  WFL's       Palpation   Spinal mobility  slightly tight lumbar through the thoracic spine with CPA mobs     Palpation comment  muscular tightness through bilat thoracolumbar paraspinals into the posterior shoulder/lats; Rt > Lt psoas       Treatment included exercises - see HEP instructions for exercises and reps x 15 min  Treatment was concluded with moist heat and estim x 20 min           Objective measurements completed on examination: See above findings.              PT Education - 09/10/17 0941    Education Details  HEP TENS    Person(s) Educated  Patient    Methods  Explanation;Demonstration;Tactile cues;Verbal cues;Handout    Comprehension  Verbalized understanding;Returned demonstration;Verbal cues required;Tactile cues required          PT Long  Term Goals - 09/10/17 0953      PT LONG TERM GOAL #1   Title  Decrease by 75-80% allowing patient to sleep without awakening due to pain 10/17/17    Time  6    Period  Weeks    Status  New      PT LONG TERM GOAL #2   Title  Decrease muscular tightness and discomfort to palpation through the thoracolumbar spine area 10/17/17    Time  6    Period  Weeks    Status  New      PT LONG TERM GOAL #3   Title  Patient to report return to normal functional activities with minimal to no pain 10/17/17     Time  6    Period  Weeks    Status  New      PT LONG TERM GOAL #4   Title  Independent in HEP 10/17/17    Time  6    Period  Weeks    Status  New      PT LONG TERM GOAL #5   Title  Improve FOTO to </= 30% limitation 10/17/17    Time  6    Period  Weeks    Status  New             Plan - 09/10/17 0948    Clinical Impression Statement  Kourtlynn presents with 3 month history of bilat mid to low back pain with no known injury. Symptoms were of gradual onset and have increased in intensity in the past few weeks. Patient has some forward posture; mild limitations of trunk mobility and ROM; muscular tightness to palpation through the thoracolumbar paraspinals and lats bilaterally as well as into the psoas Rt > Lt . She will benefit form PT to address problems identified.     History and Personal Factors relevant to plan of care:  unremarkable per pt report     Clinical Presentation  Evolving    Clinical Decision Making  Low    Rehab Potential  Good    PT Frequency  2x / week    PT Duration  6 weeks    PT Treatment/Interventions  Patient/family education;ADLs/Self Care Home Management;Cryotherapy;Electrical Stimulation;Iontophoresis 4mg /ml Dexamethasone;Moist Heat;Ultrasound;Dry needling;Manual techniques;Neuromuscular re-education;Therapeutic activities;Therapeutic exercise    PT Next Visit Plan  assess response to treatment; review HEP; add seated stretch for psoas; add deep tissue work  through the thoracolumbar musculature; modalities as indicated; back care education     Consulted and Agree with Plan of Care  Patient       Patient will benefit from skilled therapeutic intervention in order to improve the following deficits and impairments:  Postural dysfunction, Improper body mechanics, Pain, Increased fascial restricitons, Increased muscle spasms, Decreased range of motion  Visit Diagnosis: Acute bilateral low back pain without sciatica - Plan: PT plan of care cert/re-cert  Pain in thoracic spine - Plan: PT plan of care cert/re-cert  Other symptoms and signs involving the musculoskeletal system - Plan: PT plan of care cert/re-cert     Problem List Patient Active Problem List   Diagnosis Date Noted  . Cyst of cervix 09/05/2017  . 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 Acute right-sided low back pain without sciatica 09/03/2017  . Mid back pain 09/03/2017  . Left lower quadrant pain 09/03/2017  . Moderate episode of recurrent major depressive disorder (HCC) 09/02/2017  . Heart palpitations 09/01/2016  . Right hand pain 09/01/2016  . CKD (chronic kidney disease) stage 3, GFR 30-59 ml/min (HCC) 03/02/2016  . Osteoporosis, 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  . Depression 05/24/2015  . DDD (degenerative disc disease), lumbar 05/24/2015  . Mixed hyperlipidemia 05/24/2015  . Migraine without aura and with status migrainosus, not intractable 05/24/2015  . Post-menopausal 05/24/2015  . Family history of heart attack 05/24/2015  . Osteoporosis 05/24/2015    Jasman Murri Rober Minion PT, MPH  09/10/2017, 10:07 AM  [facility] [address] [place] [address] [place], [place], [address] Phone: [phone]   Fax:  [phone]  Name: Jaylena Holloway MRN: [id number] Date of Birth: [birth year]

## 2017-09-10 NOTE — Patient Instructions (Signed)
   TENS UNIT  This is helpful for muscle pain and spasm.   Search and Purchase a TENS 7000 2nd edition at www.tenspros.com or www.amazon.com  (It should be less than $30)     TENS unit instructions:   Do not shower or bathe with the unit on  Turn the unit off before removing electrodes or batteries  If the electrodes lose stickiness add a drop of water to the electrodes after they are disconnected from the unit and place on plastic sheet. If you continued to have difficulty, call the TENS unit company to purchase more electrodes.  Do not apply lotion on the skin area prior to use. Make sure the skin is clean and dry as this will help prolong the life of the electrodes.  After use, always check skin for unusual red areas, rash or other skin difficulties. If there are any skin problems, does not apply electrodes to the same area.  Never remove the electrodes from the unit by pulling the wires.  Do not use the TENS unit or electrodes other than as directed.  Do not change electrode placement without consulting your therapist or physician.  Keep 2 fingers with between each electrode.   Access Code: [email or web address]  URL: [email or web address]  Date: 09/10/2017  Prepared by: Corlis Leak   Exercises  Cat Cow - 3 reps - 3 sets - 10 sec hold - 1x daily - 7x weekly  Cat-Camel to Child's Pose - 3 reps - 3 sets - 20 hold - 1x daily - 7x weekly  Supine Lower Trunk Rotation - 3 reps - 1 sets - 20 hold - 1x daily - 7x weekly  Patient Education  TENS UNIT

## 2017-09-15 ENCOUNTER — Ambulatory Visit (INDEPENDENT_AMBULATORY_CARE_PROVIDER_SITE_OTHER): Payer: Medicare Other | Admitting: Rehabilitative and Restorative Service Providers"

## 2017-09-15 DIAGNOSIS — M545 Low back pain, unspecified: Secondary | ICD-10-CM

## 2017-09-15 DIAGNOSIS — M546 Pain in thoracic spine: Secondary | ICD-10-CM | POA: Diagnosis not present

## 2017-09-15 DIAGNOSIS — R29898 Other symptoms and signs involving the musculoskeletal system: Secondary | ICD-10-CM | POA: Diagnosis not present

## 2017-09-15 NOTE — Patient Instructions (Signed)
Double Knee to Chest (Flexion)    Gently pull both knees toward chest. Feel stretch in lower back or buttock area. Breathing deeply, Hold _30___ seconds. Repeat __3__ times. Do __1-2__ sessions per day.

## 2017-09-15 NOTE — Therapy (Signed)
[facility] [place] [address] [place] [address] [place], [place], [address] Phone: [phone]   Fax:  [phone]  Physical Therapy Treatment  Patient Details  Name: Kristen Morris MRN: [id number] Date of Birth: 06/24/1950 Referring Provider: Tandy Gaw PA-C   Encounter Date: 09/15/2017  PT End of Session - 09/15/17 0850    Visit Number  2    Number of Visits  12    Date for PT Re-Evaluation  10/17/17    PT Start Time  0847    PT Stop Time  0939    PT Time Calculation (min)  52 min    Activity Tolerance  Patient tolerated treatment well       Past Medical History:  Diagnosis Date  . Hyperlipidemia     Past Surgical History:  Procedure Laterality Date  . CESAREAN SECTION      There were no vitals filed for this visit.  Subjective Assessment - 09/15/17 0852    Subjective  Patient reports that pain is improved but continues to have intermittent pain in the both sides. She is not sure what couses her pain at this point.     Currently in Pain?  Yes    Pain Score  3     Pain Location  Back    Pain Orientation  Right;Left;Mid;Lower    Pain Descriptors / Indicators  Tightness    Pain Type  Acute pain    Pain Onset  More than a month ago    Pain Frequency  Intermittent                       [facility] Adult PT Treatment/Exercise - 09/15/17 0001      Lumbar Exercises: Stretches   Double Knee to Chest Stretch  2 reps;30 seconds   knees apart to incresae stretch    Lower Trunk Rotation  2 reps;30 seconds    Hip Flexor Stretch  Right;Left;2 reps;30 seconds   seated    Other Lumbar Stretch Exercise  seated trunk flexion 30 sec x 2       Lumbar Exercises: Quadruped   Madcat/Old Horse  5 reps    Other Quadruped Lumbar Exercises  child's pose stretch 30 sec x 1; walking hands to side with hold 30 sec to stretch laterally 30 sec x 1 each side       Moist Heat Therapy   Number Minutes Moist Heat  20 Minutes    Moist Heat  Location  Lumbar Spine   thoracic      Electrical Stimulation   Electrical Stimulation Location  bilat mid to lower thoracic paraspinals     Electrical Stimulation Action  IFC    Electrical Stimulation Parameters  to tolerance    Electrical Stimulation Goals  Pain;Tone      Manual Therapy   Manual therapy comments  pt prone with pillow long ways under trunk    Joint Mobilization  thoracic CPA and lateral glides     Soft tissue mobilization  deep tissue work through the thoracic and lumbar paraspinals; lats bilat     Myofascial Release  thoracic paraspinals              PT Education - 09/15/17 0901    Education Details  HEP     Person(s) Educated  Patient    Methods  Explanation;Demonstration;Tactile cues;Verbal cues;Handout    Comprehension  Verbalized understanding;Returned demonstration;Verbal cues required;Tactile cues required  PT Long Term Goals - 09/15/17 0854      PT LONG TERM GOAL #1   Title  Decrease by 75-80% allowing patient to sleep without awakening due to pain 10/17/17    Time  6    Period  Weeks    Status  On-going      PT LONG TERM GOAL #2   Title  Decrease muscular tightness and discomfort to palpation through the thoracolumbar spine area 10/17/17    Time  6    Period  Weeks    Status  On-going      PT LONG TERM GOAL #3   Title  Patient to report return to normal functional activities with minimal to no pain 10/17/17     Time  6    Period  Weeks    Status  On-going      PT LONG TERM GOAL #4   Title  Independent in HEP 10/17/17    Time  6    Period  Weeks    Status  On-going      PT LONG TERM GOAL #5   Title  Improve FOTO to </= 30% limitation 10/17/17    Time  6    Period  Weeks    Status  On-going            Plan - 09/15/17 0934    Clinical Impression Statement  Excellent reposnse to initial treatment and HEP - added stretch for hip flexors with some increased LBP - modified for HEP. Added deep tissue work through the  thoracic and lumbar paraspinas. Progressing well rehab.     Rehab Potential  Good    PT Frequency  2x / week    PT Duration  6 weeks    PT Treatment/Interventions  Patient/family education;ADLs/Self Care Home Management;Cryotherapy;Electrical Stimulation;Iontophoresis 4mg /ml Dexamethasone;Moist Heat;Ultrasound;Dry needling;Manual techniques;Neuromuscular re-education;Therapeutic activities;Therapeutic exercise    PT Next Visit Plan  assess response to deep tissue work through the thoracolumbar musculature; modalities as indicated; back care education continue with manual work consider DN if muscular tightness persists     Consulted and Agree with Plan of Care  Patient       Patient will benefit from skilled therapeutic intervention in order to improve the following deficits and impairments:  Postural dysfunction, Improper body mechanics, Pain, Increased fascial restricitons, Increased muscle spasms, Decreased range of motion  Visit Diagnosis: Acute bilateral low back pain without sciatica  Pain in thoracic spine  Other symptoms and signs involving the musculoskeletal system     Problem List Patient Active Problem List   Diagnosis Date Noted  . Cyst of cervix 09/05/2017  . 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 Acute right-sided low back pain without sciatica 09/03/2017  . Mid back pain 09/03/2017  . Left lower quadrant pain 09/03/2017  . Moderate episode of recurrent major depressive disorder (HCC) 09/02/2017  . Heart palpitations 09/01/2016  . Right hand pain 09/01/2016  . CKD (chronic kidney disease) stage 3, GFR 30-59 ml/min (HCC) 03/02/2016  . Osteoporosis, 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  . Depression 05/24/2015  . DDD (degenerative disc disease), lumbar 05/24/2015  . Mixed hyperlipidemia 05/24/2015  . Migraine without aura and with status migrainosus, not intractable 05/24/2015  . Post-menopausal 05/24/2015  . Family history of heart attack 05/24/2015  .  Osteoporosis 05/24/2015    Lile Mccurley Rober Minion PT, MPH  09/15/2017, 9:38 AM  [facility] [address] [place] [address] [place], [place], [address] Phone: [phone]   Fax:  [phone]  Name: Kristen Morris MRN: [id number] Date of Birth: 09-02-1950

## 2017-09-17 ENCOUNTER — Ambulatory Visit (INDEPENDENT_AMBULATORY_CARE_PROVIDER_SITE_OTHER): Payer: Medicare Other | Admitting: Rehabilitative and Restorative Service Providers"

## 2017-09-17 ENCOUNTER — Encounter: Payer: Self-pay | Admitting: Rehabilitative and Restorative Service Providers"

## 2017-09-17 DIAGNOSIS — R29898 Other symptoms and signs involving the musculoskeletal system: Secondary | ICD-10-CM

## 2017-09-17 DIAGNOSIS — M546 Pain in thoracic spine: Secondary | ICD-10-CM

## 2017-09-17 DIAGNOSIS — M545 Low back pain, unspecified: Secondary | ICD-10-CM

## 2017-09-17 NOTE — Therapy (Signed)
[facility] [place] [address] [place] [address] [place], [place], [address] Phone: [phone]   Fax:  [phone]  Physical Therapy Treatment  Patient Details  Name: Kristen Morris MRN: [id number] Date of Birth: 28-Jun-1950 Referring Provider: Tandy Gaw PA-C   Encounter Date: 09/17/2017  PT End of Session - 09/17/17 1020    Visit Number  3    Number of Visits  12    Date for PT Re-Evaluation  10/17/17    PT Start Time  1018    PT Stop Time  1112    PT Time Calculation (min)  54 min    Activity Tolerance  Patient tolerated treatment well       Past Medical History:  Diagnosis Date  . Hyperlipidemia     Past Surgical History:  Procedure Laterality Date  . CESAREAN SECTION      There were no vitals filed for this visit.  Subjective Assessment - 09/17/17 1020    Subjective  Pain in the back is a little worse today - stiffer - not sure why. She didn't do anything different that she knows about.     Currently in Pain?  Yes    Pain Score  4     Pain Location  Back    Pain Orientation  Right;Left;Mid;Lower    Pain Descriptors / Indicators  Tightness    Pain Type  Acute pain    Pain Onset  More than a month ago    Pain Frequency  Intermittent                       [facility] Adult PT Treatment/Exercise - 09/17/17 0001      Lumbar Exercises: Stretches   Double Knee to Chest Stretch  2 reps;30 seconds   knees apart to incresae stretch    Lower Trunk Rotation  2 reps;30 seconds    Hip Flexor Stretch  Right;Left;2 reps;30 seconds   seated    Other Lumbar Stretch Exercise  seated trunk flexion 30 sec x 2     Other Lumbar Stretch Exercise  seated lateral trunk flexion 30 sec x 3 reps       Lumbar Exercises: Aerobic   Nustep  L5 x 5 min arms 9       Lumbar Exercises: Quadruped   Madcat/Old Horse  5 reps    Other Quadruped Lumbar Exercises  child's pose stretch 30 sec x 1; walking hands to side with hold 30 sec to  stretch laterally 30 sec x 1 each side       Moist Heat Therapy   Number Minutes Moist Heat  20 Minutes    Moist Heat Location  Lumbar Spine   thoracic      Electrical Stimulation   Electrical Stimulation Location  bilat mid to lower thoracic paraspinals     Electrical Stimulation Action  IFC    Electrical Stimulation Parameters  to tolerance    Electrical Stimulation Goals  Pain;Tone      Manual Therapy   Manual therapy comments  pt prone with pillow long ways under trunk    Joint Mobilization  thoracic CPA and lateral glides     Soft tissue mobilization  deep tissue work through the thoracic and lumbar paraspinals; lats bilat     Myofascial Release  thoracic paraspinals              PT Education - 09/17/17 1030    Education Details  HEP  Person(s) Educated  Patient    Methods  Explanation;Demonstration;Tactile cues;Verbal cues;Handout    Comprehension  Verbalized understanding;Returned demonstration;Verbal cues required;Tactile cues required          PT Long Term Goals - 09/15/17 0854      PT LONG TERM GOAL #1   Title  Decrease by 75-80% allowing patient to sleep without awakening due to pain 10/17/17    Time  6    Period  Weeks    Status  On-going      PT LONG TERM GOAL #2   Title  Decrease muscular tightness and discomfort to palpation through the thoracolumbar spine area 10/17/17    Time  6    Period  Weeks    Status  On-going      PT LONG TERM GOAL #3   Title  Patient to report return to normal functional activities with minimal to no pain 10/17/17     Time  6    Period  Weeks    Status  On-going      PT LONG TERM GOAL #4   Title  Independent in HEP 10/17/17    Time  6    Period  Weeks    Status  On-going      PT LONG TERM GOAL #5   Title  Improve FOTO to </= 30% limitation 10/17/17    Time  6    Period  Weeks    Status  On-going            Plan - 09/17/17 1022    Clinical Impression Statement  Soem increase in symptoms today -  not sure why. Patient added exercise without difficulty. Good response to manual work through the thoracic spine. Gradually progressing toward stated goals of therapy.     Rehab Potential  Good    PT Frequency  2x / week    PT Duration  6 weeks    PT Treatment/Interventions  Patient/family education;ADLs/Self Care Home Management;Cryotherapy;Electrical Stimulation;Iontophoresis 4mg /ml Dexamethasone;Moist Heat;Ultrasound;Dry needling;Manual techniques;Neuromuscular re-education;Therapeutic activities;Therapeutic exercise    PT Next Visit Plan  continue deep tissue work through the thoracolumbar musculature; modalities as indicated; back care education continue with manual work consider DN if muscular tightness persists     Consulted and Agree with Plan of Care  Patient       Patient will benefit from skilled therapeutic intervention in order to improve the following deficits and impairments:  Postural dysfunction, Improper body mechanics, Pain, Increased fascial restricitons, Increased muscle spasms, Decreased range of motion  Visit Diagnosis: Acute bilateral low back pain without sciatica  Pain in thoracic spine  Other symptoms and signs involving the musculoskeletal system     Problem List Patient Active Problem List   Diagnosis Date Noted  . Cyst of cervix 09/05/2017  . 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 Acute right-sided low back pain without sciatica 09/03/2017  . Mid back pain 09/03/2017  . Left lower quadrant pain 09/03/2017  . Moderate episode of recurrent major depressive disorder (HCC) 09/02/2017  . Heart palpitations 09/01/2016  . Right hand pain 09/01/2016  . CKD (chronic kidney disease) stage 3, GFR 30-59 ml/min (HCC) 03/02/2016  . Osteoporosis, 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  . Depression 05/24/2015  . DDD (degenerative disc disease), lumbar 05/24/2015  . Mixed hyperlipidemia 05/24/2015  . Migraine without aura and with status migrainosus, not intractable  05/24/2015  . Post-menopausal 05/24/2015  . Family history of heart attack 05/24/2015  . Osteoporosis 05/24/2015    Kristen Morris P  Leonor Liv PT, MPH  09/17/2017, 10:54 AM  [facility] [address]  [address] [place], [place], [address] Phone: [phone]   Fax:  [phone]  Name: Kristen Morris MRN: [id number] Date of Birth: Dec 14, 1950

## 2017-09-17 NOTE — Patient Instructions (Addendum)
Side Bend, Sitting    Sit cross-legged on floor. Bend to one side, touching elbow to floor. Hold _30__ seconds. To increase stretch, raise other arm above head.  Repeat __3_ times per session. Do _2-3__ sessions per day.

## 2017-09-22 ENCOUNTER — Encounter: Payer: Medicare Other | Admitting: Rehabilitative and Restorative Service Providers"

## 2017-09-24 ENCOUNTER — Encounter: Payer: Self-pay | Admitting: Rehabilitative and Restorative Service Providers"

## 2017-09-24 ENCOUNTER — Ambulatory Visit (INDEPENDENT_AMBULATORY_CARE_PROVIDER_SITE_OTHER): Payer: Medicare Other | Admitting: Rehabilitative and Restorative Service Providers"

## 2017-09-24 DIAGNOSIS — M545 Low back pain, unspecified: Secondary | ICD-10-CM

## 2017-09-24 DIAGNOSIS — M546 Pain in thoracic spine: Secondary | ICD-10-CM | POA: Diagnosis not present

## 2017-09-24 DIAGNOSIS — R29898 Other symptoms and signs involving the musculoskeletal system: Secondary | ICD-10-CM | POA: Diagnosis not present

## 2017-09-24 NOTE — Patient Instructions (Signed)
Scapula Adduction With Pectoralis Stretch: Low - Standing   Shoulders at 45 hands even with shoulders, keeping weight through legs, shift weight forward until you feel pull or stretch through the front of your chest. Hold _30__ seconds. Do _3__ times, _2-4__ times per day.   Scapula Adduction With Pectoralis Stretch: Mid-Range - Standing   Shoulders at 90 elbows even with shoulders, keeping weight through legs, shift weight forward until you feel pull or strength through the front of your chest. Hold __30_ seconds. Do _3__ times, __2-4_ times per day.   Scapula Adduction With Pectoralis Stretch: High - Standing   Shoulders at 120 hands up high on the doorway, keeping weight on feet, shift weight forward until you feel pull or stretch through the front of your chest. Hold _30__ seconds. Do _3__ times, _2-3__ times per day.   Stretch over large therapy ball/swiss ball  30-60 seconds 2-3 reps

## 2017-09-24 NOTE — Therapy (Signed)
[facility] [address] [place], [place], [address] Phone: [phone]   Fax:  [phone]  Physical Therapy Treatment  Patient Details  Name: Kristen Morris MRN: [id number] Date of Birth: 1950/11/30 Referring Provider: Tandy GawJade Breeback PA-C   Encounter Date: 09/24/2017  PT End of Session - 09/24/17 0852    Visit Number  4    Number of Visits  12    Date for PT Re-Evaluation  10/17/17    PT Start Time  0845    PT Stop Time  0944    PT Time Calculation (min)  59 min    Activity Tolerance  Patient tolerated treatment well       Past Medical History:  Diagnosis Date  . Hyperlipidemia     Past Surgical History:  Procedure Laterality Date  . CESAREAN SECTION      There were no vitals filed for this visit.  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 Working on her exercises at home. Did not purchase a TENS unit but sewed up bags for rice - needs to purchase rice.     Currently in Pain?  No/denies                       [facility] - 09/24/17 0001      Lumbar Exercises: Stretches   Double Knee to Chest Stretch  2 reps;30 seconds   knees apart to incresae stretch    Lower Trunk Rotation  2 reps;30 seconds    Hip Flexor Stretch  Right;Left;2 reps;30 seconds   seated    Other Lumbar Stretch Exercise  seated trunk flexion 30 sec x 2     Other Lumbar Stretch Exercise  seated lateral trunk flexion 30 sec x 3 reps       Lumbar Exercises: Aerobic   Nustep  L6 x 5 min arms 10       Shoulder Exercises: Therapy Ball   Other Therapy Ball Exercises  upper body stretch over large ball 45-60 sec x 2 - tried adapted for home using pillows       Shoulder Exercises: Stretch   Other Shoulder Stretches  3 way doorway stretch 30 sec x 2 each       Moist Heat Therapy   Number Minutes Moist Heat   20 Minutes    Moist Heat Location  Lumbar Spine   thoracic      Electrical Stimulation   Electrical Stimulation Location  bilat mid to lower thoracic paraspinals     Electrical Stimulation Action  IFC    Electrical Stimulation Parameters  to tolerance    Electrical Stimulation Goals  Pain;Tone      Manual Therapy   Manual therapy comments  pt prone with pillow long ways under trunk    Joint Mobilization  thoracic CPA and lateral glides     Soft tissue mobilization  deep tissue work through the thoracic and lumbar paraspinals; lats bilat     Myofascial Release  thoracic paraspinals              PT Education - 09/24/17 0913    Education Details  HEP     Person(s) Educated  Patient    Methods  Explanation;Demonstration;Tactile cues;Verbal cues;Handout    Comprehension  Verbalized understanding;Returned demonstration;Verbal cues required;Tactile cues required  PT Long Term Goals - 09/24/17 0853      PT LONG TERM GOAL #1   Title  Decrease by 75-80% allowing patient to sleep without awakening due to pain 10/17/17    Time  6    Period  Weeks    Status  On-going      PT LONG TERM GOAL #2   Title  Decrease muscular tightness and discomfort to palpation through the thoracolumbar spine area 10/17/17    Time  6    Period  Weeks    Status  On-going      PT LONG TERM GOAL #3   Title  Patient to report return to normal functional activities with minimal to no pain 10/17/17     Time  6    Period  Weeks    Status  On-going      PT LONG TERM GOAL #4   Title  Independent in HEP 10/17/17    Time  6    Period  Weeks    Status  On-going      PT LONG TERM GOAL #5   Title  Improve FOTO to </= 30% limitation 10/17/17    Time  6    Period  Weeks    Status  On-going            Plan - 09/24/17 0855    Clinical Impression Statement  Continued progress with decrease in symptoms and improved thoracic and lumbar mobility. Progressing well toward stated goals of  therapy.     Rehab Potential  Good    PT Frequency  2x / week    PT Duration  6 weeks    PT Treatment/Interventions  Patient/family education;ADLs/Self Care Home Management;Cryotherapy;Electrical Stimulation;Iontophoresis 4mg /ml Dexamethasone;Moist Heat;Ultrasound;Dry needling;Manual techniques;Neuromuscular re-education;Therapeutic activities;Therapeutic exercise    PT Next Visit Plan  continue deep tissue work through the thoracolumbar musculature; modalities as indicated; back care education continue with manual work consider DN if muscular tightness persists     Consulted and Agree with Plan of Care  Patient       Patient will benefit from skilled therapeutic intervention in order to improve the following deficits and impairments:  Postural dysfunction, Improper body mechanics, Pain, Increased fascial restricitons, Increased muscle spasms, Decreased range of motion  Visit Diagnosis: Acute bilateral low back pain without sciatica  Pain in thoracic spine  Other symptoms and signs involving the musculoskeletal system     Problem List Patient Active Problem List   Diagnosis Date Noted  . Cyst of cervix 09/05/2017  . 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 Acute right-sided low back pain without sciatica 09/03/2017  . Mid back pain 09/03/2017  . Left lower quadrant pain 09/03/2017  . Moderate episode of recurrent major depressive disorder (HCC) 09/02/2017  . Heart palpitations 09/01/2016  . Right hand pain 09/01/2016  . CKD (chronic kidney disease) stage 3, GFR 30-59 ml/min (HCC) 03/02/2016  . Osteoporosis, 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  . Depression 05/24/2015  . DDD (degenerative disc disease), lumbar 05/24/2015  . Mixed hyperlipidemia 05/24/2015  . Migraine without aura and with status migrainosus, not intractable 05/24/2015  . Post-menopausal 05/24/2015  . Family history of heart attack 05/24/2015  . Osteoporosis 05/24/2015    Zaviyar Rahal Rober Minion PT, MPH  09/24/2017,  9:30 AM  [facility] [address] [place], [place], [address] Phone: [phone]   Fax:  [phone]  Name: Kristen Morris MRN: [id number] Date of Birth: Feb 01, 1950

## 2017-09-26 ENCOUNTER — Ambulatory Visit (INDEPENDENT_AMBULATORY_CARE_PROVIDER_SITE_OTHER): Payer: Medicare Other | Admitting: Rehabilitative and Restorative Service Providers"

## 2017-09-26 ENCOUNTER — Encounter: Payer: Self-pay | Admitting: Rehabilitative and Restorative Service Providers"

## 2017-09-26 DIAGNOSIS — R29898 Other symptoms and signs involving the musculoskeletal system: Secondary | ICD-10-CM | POA: Diagnosis not present

## 2017-09-26 DIAGNOSIS — M546 Pain in thoracic spine: Secondary | ICD-10-CM | POA: Diagnosis not present

## 2017-09-26 DIAGNOSIS — M545 Low back pain, unspecified: Secondary | ICD-10-CM

## 2017-09-26 NOTE — Therapy (Addendum)
[place], [place], [address] Phone: [phone]   Fax:  [phone]  Physical Therapy Treatment  Patient Details  Name: Kristen Morris MRN: [id number] Date of Birth: 1950/12/22 Referring Provider: Iran Planas PA-C   Encounter Date: 09/26/2017  PT End of Session - 09/26/17 0942    Visit Number  5    Number of Visits  12    Date for PT Re-Evaluation  10/17/17    PT Start Time  0941    PT Stop Time  1022    PT Time Calculation (min)  41 min    Activity Tolerance  Patient tolerated treatment well       Past Medical History:  Diagnosis Date  . Hyperlipidemia     Past Surgical History:  Procedure Laterality Date  . CESAREAN SECTION      There were no vitals filed for this visit.  Subjective Assessment - 09/26/17 0944    Subjective  Much worse this morning. Had some increase in pain yesterday as well. Really hurts. Patient thinks the deep tissue massage may be what is irritating the symptoms. She also reports that she cleaned her house yesterday; volunteered standing several hours; washed the dog; it was a busy/physical day. She continues to work on exercises but has not gotten rice for her moist heat. She did not order TENS but may need to.     Currently in Pain?  Yes    Pain Score  5    8/10 when she got up this morning    Pain Location  Back    Pain Orientation  Right;Left;Mid;Lower    Pain Descriptors / Indicators  Tightness    Pain Type  Acute pain                       [facility] Adult PT Treatment/Exercise - 09/26/17 0001      Lumbar Exercises: Stretches   Double Knee to Chest Stretch  2 reps;30 seconds   knees apart to incresae stretch    Lower Trunk Rotation  2 reps;30 seconds    Other Lumbar Stretch Exercise  seated trunk flexion 30 sec x 2     Other Lumbar Stretch Exercise  seated lateral trunk flexion 30 sec x 3 reps       Lumbar Exercises: Aerobic   Nustep   L6 x 7 min arms 10       Lumbar Exercises: Quadruped   [place]  5 reps    Other Quadruped Lumbar Exercises  child's pose stretch 30 sec x 1; walking hands to side with hold 30 sec to stretch laterally 30 sec x 1 each side       Shoulder Exercises: Therapy Ball   Other Therapy Ball Exercises  upper body stretch over large ball 45-60 sec x 2 - tried adapted for home using pillows       Moist Heat Therapy   Number Minutes Moist Heat  20 Minutes    Moist Heat Location  Lumbar Spine   thoracic      Electrical Stimulation   Electrical Stimulation Location  bilat mid to lower thoracic paraspinals     Electrical Stimulation Action  IFC    Electrical Stimulation Parameters  to tolerance    Electrical Stimulation Goals  Pain;Tone                  PT Long Term Goals - 09/24/17 8115  PT LONG TERM GOAL #1   Title  Decrease by 75-80% allowing patient to sleep without awakening due to pain 10/17/17    Time  6    Period  Weeks    Status  On-going      PT LONG TERM GOAL #2   Title  Decrease muscular tightness and discomfort to palpation through the thoracolumbar spine area 10/17/17    Time  6    Period  Weeks    Status  On-going      PT LONG TERM GOAL #3   Title  Patient to report return to normal functional activities with minimal to no pain 10/17/17     Time  6    Period  Weeks    Status  On-going      PT LONG TERM GOAL #4   Title  Independent in HEP 10/17/17    Time  6    Period  Weeks    Status  On-going      PT LONG TERM GOAL #5   Title  Improve FOTO to </= 30% limitation 10/17/17    Time  6    Period  Weeks    Status  On-going            Plan - 09/26/17 3785    Clinical Impression Statement  Flare up of symptoms yesterday and again this morning. No known irritation of symptoms. Patient has continued mid to low back muscular tightness bilat paraspinals. Responded well to gentle exercise and modalities - no manual work at [employer] request. Flare up  of symptoms likely due to significant increase in activity yesterday.     Rehab Potential  Good    PT Frequency  2x / week    PT Duration  6 weeks    PT Treatment/Interventions  Patient/family education;ADLs/Self Care Home Management;Cryotherapy;Electrical Stimulation;Iontophoresis 2m/ml Dexamethasone;Moist Heat;Ultrasound;Dry needling;Manual techniques;Neuromuscular re-education;Therapeutic activities;Therapeutic exercise    PT Next Visit Plan  continue deep tissue work through the thoracolumbar musculature as tolerated(pt requests no STM today); modalities as indicated; back care education continue with manual work consider DN if muscular tightness persists     Consulted and Agree with Plan of Care  Patient       Patient will benefit from skilled therapeutic intervention in order to improve the following deficits and impairments:  Postural dysfunction, Improper body mechanics, Pain, Increased fascial restricitons, Increased muscle spasms, Decreased range of motion  Visit Diagnosis: Acute bilateral low back pain without sciatica  Pain in thoracic spine  Other symptoms and signs involving the musculoskeletal system     Problem List Patient Active Problem List   Diagnosis Date Noted  . Cyst of cervix 09/05/2017  . 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 Acute right-sided low back pain without sciatica 09/03/2017  . Mid back pain 09/03/2017  . Left lower quadrant pain 09/03/2017  . Moderate episode of recurrent major depressive disorder ([place] 09/02/2017  . Heart palpitations 09/01/2016  . Right hand pain 09/01/2016  . CKD (chronic kidney disease) stage 3, GFR 30-59 ml/min (HCC) 03/02/2016  . Osteoporosis, 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  . Depression 05/24/2015  . DDD (degenerative disc disease), lumbar 05/24/2015  . Mixed hyperlipidemia 05/24/2015  . Migraine without aura and with status migrainosus, not intractable 05/24/2015  . Post-menopausal 05/24/2015  . Family  history of heart attack 05/24/2015  . Osteoporosis 05/24/2015    Rik Wadel PNilda SimmerPT, MPH  09/26/2017, 10:12 AM  [facility] [address] [address]  [place], [place], [address] Phone: [phone]   Fax:  [phone]  Name: Kristen Morris MRN: [id number] Date of Birth: [birth year]  PHYSICAL THERAPY DISCHARGE SUMMARY  Visits from Start of Care: 5  Current functional level related to goals / functional outcomes: See last progress note for discharge status     Remaining deficits: Unknown    Education / Equipment: HEP  Plan: Patient agrees to discharge.  Patient goals were not met. Patient is being discharged due to the patient's request.  ?????    Kristen Morris P. Helene Kelp PT, MPH 10/22/17 10:40 AM

## 2017-09-29 ENCOUNTER — Encounter: Payer: Medicare Other | Admitting: Rehabilitative and Restorative Service Providers"

## 2017-10-01 ENCOUNTER — Encounter: Payer: Medicare Other | Admitting: Rehabilitative and Restorative Service Providers"

## 2017-10-06 ENCOUNTER — Encounter: Payer: Medicare Other | Admitting: Rehabilitative and Restorative Service Providers"

## 2017-10-08 ENCOUNTER — Encounter: Payer: Medicare Other | Admitting: Rehabilitative and Restorative Service Providers"

## 2017-12-10 ENCOUNTER — Other Ambulatory Visit: Payer: Self-pay

## 2017-12-10 ENCOUNTER — Ambulatory Visit: Payer: Medicare Other

## 2017-12-10 DIAGNOSIS — M810 Age-related osteoporosis without current pathological fracture: Secondary | ICD-10-CM

## 2017-12-10 LAB — COMPLETE METABOLIC PANEL WITH GFR
AG Ratio: 2 (calc) (ref 1.0–2.5)
ALT: 24 U/L (ref 6–29)
AST: 30 U/L (ref 10–35)
Albumin: 4.2 g/dL (ref 3.6–5.1)
Alkaline phosphatase (APISO): 37 U/L (ref 33–130)
BUN: 18 mg/dL (ref 7–25)
CALCIUM: 9.9 mg/dL (ref 8.6–10.4)
CO2: 30 mmol/L (ref 20–32)
CREATININE: 0.98 mg/dL (ref 0.50–0.99)
Chloride: 106 mmol/L (ref 98–110)
GFR, EST AFRICAN AMERICAN: 69 mL/min/{1.73_m2} (ref >=60)
GFR, EST NON AFRICAN AMERICAN: 60 mL/min/{1.73_m2} (ref >=60)
GLUCOSE: 91 mg/dL (ref 65–99)
Globulin: 2.1 g/dL (calc) (ref 1.9–3.7)
POTASSIUM: 4.5 mmol/L (ref 3.5–5.3)
Sodium: 141 mmol/L (ref 135–146)
TOTAL PROTEIN: 6.3 g/dL (ref 6.1–8.1)
Total Bilirubin: 0.5 mg/dL (ref 0.2–1.2)

## 2017-12-10 NOTE — Progress Notes (Signed)
Call pt: labs look great.

## 2017-12-10 NOTE — Progress Notes (Signed)
Ordered CMP for Prolia injection.  

## 2017-12-17 ENCOUNTER — Ambulatory Visit: Payer: Medicare Other

## 2018-01-14 ENCOUNTER — Ambulatory Visit (INDEPENDENT_AMBULATORY_CARE_PROVIDER_SITE_OTHER): Payer: Medicare Other | Admitting: Physician Assistant

## 2018-01-14 VITALS — BP 126/86 | HR 79 | Temp 97.5°F

## 2018-01-14 DIAGNOSIS — M810 Age-related osteoporosis without current pathological fracture: Secondary | ICD-10-CM

## 2018-01-14 MED ORDER — DENOSUMAB 60 MG/ML ~~LOC~~ SOSY
60.0000 mg | PREFILLED_SYRINGE | Freq: Once | SUBCUTANEOUS | Status: AC
  Administered 2018-01-14: 60 mg via SUBCUTANEOUS

## 2018-01-14 NOTE — Progress Notes (Signed)
Pt in today for Prolia Injection. Pt had CMP on 12/11/2018 per Dr. Darra LisMethaney okay to use this lab. Injection given in left arm SQ without complications.  Agree with above plan. Tandy GawJade Breeback PA-C

## 2018-01-15 ENCOUNTER — Telehealth: Payer: Self-pay

## 2018-01-21 ENCOUNTER — Other Ambulatory Visit: Payer: Self-pay

## 2018-01-21 MED ORDER — CLOBETASOL PROPIONATE 0.05 % EX CREA: 1.0000 "application " | TOPICAL_CREAM | Freq: Two times a day (BID) | CUTANEOUS | 2 refills | Status: AC

## 2018-01-22 ENCOUNTER — Telehealth: Payer: Self-pay

## 2018-01-22 DIAGNOSIS — N888 Other specified noninflammatory disorders of cervix uteri: Secondary | ICD-10-CM

## 2018-01-22 NOTE — Telephone Encounter (Signed)
-----   Message from Jomarie Longs, [place] sent at 12/22/2017  2:15 PM EST ----- Note to get follow up cyst on cervix ordered. Ok to order?  ----- Message ----- From: Nolene Ebbs Sent: 09/05/2017   4:08 PM EST To: Jomarie Longs, PA-C  Follow up cyst on cervix with u/s

## 2018-01-22 NOTE — Telephone Encounter (Signed)
Called and spoke with patient. She would like to have the follow-up u/s done for the cyst on her cervix. Thanks!

## 2018-01-23 NOTE — Addendum Note (Signed)
Addended by: Jomarie Longs on: 01/23/2018 05:42 PM   Modules accepted: Orders

## 2018-01-23 NOTE — Telephone Encounter (Signed)
Done

## 2018-01-28 ENCOUNTER — Other Ambulatory Visit: Payer: No Typology Code available for payment source

## 2018-02-02 ENCOUNTER — Ambulatory Visit (INDEPENDENT_AMBULATORY_CARE_PROVIDER_SITE_OTHER): Payer: Medicare Other

## 2018-02-02 DIAGNOSIS — N888 Other specified noninflammatory disorders of cervix uteri: Secondary | ICD-10-CM | POA: Diagnosis not present

## 2018-02-03 NOTE — Telephone Encounter (Signed)
Call pt: unchanged benign cyst at cervix. Great news!

## 2018-02-19 NOTE — Telephone Encounter (Signed)
Error

## 2018-03-04 ENCOUNTER — Telehealth: Payer: Self-pay

## 2018-03-04 DIAGNOSIS — G43001 Migraine without aura, not intractable, with status migrainosus: Secondary | ICD-10-CM

## 2018-03-04 DIAGNOSIS — E782 Mixed hyperlipidemia: Secondary | ICD-10-CM

## 2018-03-04 MED ORDER — VERAPAMIL HCL 80 MG PO TABS: 80.0000 mg | ORAL_TABLET | Freq: Two times a day (BID) | ORAL | 4 refills | Status: DC

## 2018-03-04 MED ORDER — LOVASTATIN 20 MG PO TABS: 20.0000 mg | ORAL_TABLET | Freq: Every day | ORAL | 4 refills | Status: DC

## 2018-03-04 NOTE — Telephone Encounter (Signed)
RF sent to mail order per pt request

## 2018-03-23 ENCOUNTER — Telehealth: Payer: Self-pay | Admitting: Physician Assistant

## 2018-03-23 NOTE — Telephone Encounter (Signed)
-----   Message from Jomarie Longs, [place] sent at 09/05/2017  4:08 PM EDT ----- Follow up cyst on cervix with u/s

## 2018-06-09 ENCOUNTER — Telehealth: Payer: Self-pay | Admitting: Neurology

## 2018-06-09 NOTE — Telephone Encounter (Signed)
Patient called and left voicemail regarding painful cysts under right arm. She states first started a couple months ago, she has had several and has several inflamed now. She uses warm compresses for pain relief and has opened them on her own. Has never seen a doctor for this or been on antibiotics.

## 2018-06-10 ENCOUNTER — Ambulatory Visit (INDEPENDENT_AMBULATORY_CARE_PROVIDER_SITE_OTHER): Payer: Medicare Other | Admitting: Physician Assistant

## 2018-06-10 ENCOUNTER — Encounter: Payer: Self-pay | Admitting: Physician Assistant

## 2018-06-10 ENCOUNTER — Telehealth: Payer: Self-pay | Admitting: Neurology

## 2018-06-10 VITALS — BP 124/82 | HR 93 | Temp 98.6°F | Ht 62.0 in | Wt 142.0 lb

## 2018-06-10 DIAGNOSIS — L732 Hidradenitis suppurativa: Secondary | ICD-10-CM

## 2018-06-10 MED ORDER — TRETINOIN 0.1 % EX CREA: TOPICAL_CREAM | Freq: Every day | CUTANEOUS | 1 refills | Status: DC

## 2018-06-10 MED ORDER — DOXYCYCLINE HYCLATE 100 MG PO TABS: 100.0000 mg | ORAL_TABLET | Freq: Two times a day (BID) | ORAL | 0 refills | Status: DC

## 2018-06-10 MED ORDER — CLINDAMYCIN PHOS-BENZOYL PEROX 1-5 % EX GEL: Freq: Two times a day (BID) | CUTANEOUS | 0 refills | Status: DC

## 2018-06-10 NOTE — Patient Instructions (Signed)
Hidradenitis Suppurativa  Hidradenitis suppurativa is a long-term (chronic) skin disease. It is similar to a severe form of acne, but it affects areas of the body where acne would be unusual, especially areas of the body where skin rubs against skin and becomes moist. These include:  · Underarms.  · Groin.  · Genital area.  · Buttocks.  · Upper thighs.  · Breasts.  Hidradenitis suppurativa may start out as small lumps or pimples caused by blocked sweat glands or hair follicles. Pimples may develop into deep sores that break open (rupture) and drain pus. Over time, affected areas of skin may thicken and become scarred. This condition is rare and does not spread from person to person (non-contagious).  What are the causes?  The exact cause of this condition is not known. It may be related to:  · Female and female hormones.  · An overactive disease-fighting system (immune system). The immune system may over-react to blocked hair follicles or sweat glands and cause swelling and pus-filled sores.  What increases the risk?  You are more likely to develop this condition if you:  · Are female.  · Are 11-55 years old.  · Have a family history of hidradenitis suppurativa.  · Have a personal history of acne.  · Are overweight.  · Smoke.  · Take the medicine lithium.  What are the signs or symptoms?  The first symptoms are usually painful bumps in the skin, similar to pimples. The condition may get worse over time (progress), or it may only cause mild symptoms. If the disease progresses, symptoms may include:  · Skin bumps getting bigger and growing deeper into the skin.  · Bumps rupturing and draining pus.  · Itchy, infected skin.  · Skin getting thicker and scarred.  · Tunnels under the skin (fistulas) where pus drains from a bump.  · Pain during daily activities, such as pain during walking if your groin area is affected.  · Emotional problems, such as stress or depression. This condition may affect your appearance and your  ability or willingness to wear certain clothes or do certain activities.  How is this diagnosed?  This condition is diagnosed by a health care provider who specializes in skin diseases (dermatologist). You may be diagnosed based on:  · Your symptoms and medical history.  · A physical exam.  · Testing a pus sample for infection.  · Blood tests.  How is this treated?  Your treatment will depend on how severe your symptoms are. The same treatment will not work for everybody with this condition. You may need to try several treatments to find what works best for you. Treatment may include:  · Cleaning and bandaging (dressing) your wounds as needed.  · Lifestyle changes, such as new skin care routines.  · Taking medicines, such as:  ? Antibiotics.  ? Acne medicines.  ? Medicines to reduce the activity of the immune system.  ? A diabetes medicine (metformin).  ? Birth control pills, for women.  ? Steroids to reduce swelling and pain.  · Working with a mental health care provider, if you experience emotional distress due to this condition.  If you have severe symptoms that do not get better with medicine, you may need surgery. Surgery may involve:  · Using a laser to clear the skin and remove hair follicles.  · Opening and draining deep sores.  · Removing the areas of skin that are diseased and scarred.  Follow these instructions at home:    Medicines    · Take over-the-counter and prescription medicines only as told by your health care provider.  · If you were prescribed an antibiotic medicine, take it as told by your health care provider. Do not stop taking the antibiotic even if your condition improves.  Skin care  · If you have open wounds, cover them with a clean dressing as told by your health care provider. Keep wounds clean by washing them gently with soap and water when you bathe.  · Do not shave the areas where you get hidradenitis suppurativa.  · Do not wear deodorant.  · Wear loose-fitting clothes.  · Try to avoid  getting overheated or sweaty. If you get sweaty or wet, change into clean, dry clothes as soon as you can.  · To help relieve pain and itchiness, cover sore areas with a warm, clean washcloth (warm compress) for 5-10 minutes as often as needed.  · If told by your health care provider, take a bleach bath twice a week:  ? Fill your bathtub halfway with water.  ? Pour in ½ cup of unscented household bleach.  ? Soak in the tub for 5-10 minutes.  ? Only soak from the neck down. Avoid water on your face and hair.  ? Shower to rinse off the bleach from your skin.  General instructions  · Learn as much as you can about your disease so that you have an active role in your treatment. Work closely with your health care provider to find treatments that work for you.  · If you are overweight, work with your health care provider to lose weight as recommended.  · Do not use any products that contain nicotine or tobacco, such as cigarettes and e-cigarettes. If you need help quitting, ask your health care provider.  · If you struggle with living with this condition, talk with your health care provider or work with a mental health care provider as recommended.  · Keep all follow-up visits as told by your health care provider. This is important.  Where to find more information  · Hidradenitis Suppurativa Foundation[employer] Inc.: https://www.hs-foundation.org/  Contact a health care provider if you have:  · A flare-up of hidradenitis suppurativa.  · A fever or chills.  · Trouble controlling your symptoms at home.  · Trouble doing your daily activities because of your symptoms.  · Trouble dealing with emotional problems related to your condition.  Summary  · Hidradenitis suppurativa is a long-term (chronic) skin disease. It is similar to a severe form of acne, but it affects areas of the body where acne would be unusual.  · The first symptoms are usually painful bumps in the skin, similar to pimples. The condition may get worse over time  (progress), or it may only cause mild symptoms.  · If you have open wounds, cover them with a clean dressing as told by your health care provider. Keep wounds clean by washing them gently with soap and water when you bathe.  · Besides skin care, treatment may include medicines, laser treatment, and surgery.  This information is not intended to replace advice given to you by your health care provider. Make sure you discuss any questions you have with your health care provider.  Document Released: 08/08/2003 Document Revised: 01/01/2017 Document Reviewed: 01/01/2017  Elsevier Interactive Patient Education © 2019 Elsevier Inc.

## 2018-06-10 NOTE — Progress Notes (Signed)
   Subjective:    Patient ID: Kristen Morris, female    DOB: 01/25/1950, 68 y.o.   MRN: [id number]  HPI Pt is a 68 yo female with migraines, CKD, osteoporosis who presents to the clinic with painful bumps in her right axilla. She noticed one bump a few weeks ago. She squeezed it and stuff came out and went down some. Now she has multiple bumps that she can squeeze and stuff come out of. Her right arm pit is very tender and warm. No fever or chills. She has used warm compresses with some relief. She has never had anything like this before. Pt has not changed deodorants.   .. Active Ambulatory Problems    Diagnosis Date Noted  . Depression 05/24/2015  . DDD (degenerative disc disease), lumbar 05/24/2015  . Mixed hyperlipidemia 05/24/2015  . Migraine without aura and with status migrainosus, not intractable 05/24/2015  . Post-menopausal 05/24/2015  . Family history of heart attack 05/24/2015  . Osteoporosis 05/24/2015  . Osteoporosis, 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  . CKD (chronic kidney disease) stage 3, GFR 30-59 ml/min (HCC) 03/02/2016  . Heart palpitations 09/01/2016  . Right hand pain 09/01/2016  . Moderate episode of recurrent major depressive disorder (HCC) 09/02/2017  . Acute right-sided low back pain without sciatica 09/03/2017  . Mid back pain 09/03/2017  . Left lower quadrant pain 09/03/2017  . Cyst of cervix 09/05/2017  . 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 Hidradenitis suppurativa of right axilla 06/12/2018   Resolved Ambulatory Problems    Diagnosis Date Noted  . No Resolved Ambulatory Problems   Past Medical History:  Diagnosis Date  . Hyperlipidemia       Review of Systems See HPI.     Objective:   Physical Exam        Assessment & Plan:  .Marland KitchenTerease was seen today for hidradenitis.  Diagnoses and all orders for this visit:  Hidradenitis suppurativa of right axilla -     doxycycline (VIBRA-TABS) 100 MG tablet; Take 1 tablet (100 mg total) by  mouth 2 (two) times daily. -     clindamycin-benzoyl peroxide (BENZACLIN) gel; Apply topically 2 (two) times daily. -     tretinoin (RETIN-A) 0.1 % cream; Apply topically at bedtime.  no predisposing factors. She has not gained weight. She has not changed deodorants. Dicussed dx. HO given. Doxycycline for 10 days. benzaclin for next 14 days. Continue warm compresses. Retin A to use after clears up to help prevent. Follow up or call office as needed.

## 2018-06-10 NOTE — Telephone Encounter (Signed)
Patient states [facility] called her and Tretinoin cream is not covered by insurance. She is asking if she needs this or if we can provide an alternative rx. Please advise.

## 2018-06-10 NOTE — Telephone Encounter (Signed)
Spoke with patient and made her aware. She will let us know if she has continued problems.

## 2018-06-10 NOTE — Telephone Encounter (Signed)
Ok take doxycycline and clindmycin cream first and lets see if completely clear up. That was for prevention so we might not need.

## 2018-06-11 ENCOUNTER — Other Ambulatory Visit: Payer: Self-pay | Admitting: Physician Assistant

## 2018-06-11 DIAGNOSIS — Z1231 Encounter for screening mammogram for malignant neoplasm of breast: Secondary | ICD-10-CM

## 2018-06-12 DIAGNOSIS — L732 Hidradenitis suppurativa: Secondary | ICD-10-CM | POA: Insufficient documentation

## 2018-07-01 ENCOUNTER — Ambulatory Visit (INDEPENDENT_AMBULATORY_CARE_PROVIDER_SITE_OTHER): Payer: Medicare Other

## 2018-07-01 ENCOUNTER — Other Ambulatory Visit: Payer: Self-pay

## 2018-07-01 DIAGNOSIS — Z1231 Encounter for screening mammogram for malignant neoplasm of breast: Secondary | ICD-10-CM

## 2018-07-01 NOTE — Progress Notes (Signed)
Call pt: normal mammogram. Follow up in one year.

## 2018-07-27 ENCOUNTER — Encounter: Payer: Self-pay | Admitting: Physician Assistant

## 2018-07-27 ENCOUNTER — Ambulatory Visit (INDEPENDENT_AMBULATORY_CARE_PROVIDER_SITE_OTHER): Payer: Medicare Other | Admitting: Physician Assistant

## 2018-07-27 VITALS — BP 126/84 | HR 72 | Ht 62.0 in

## 2018-07-27 DIAGNOSIS — M654 Radial styloid tenosynovitis [de Quervain]: Secondary | ICD-10-CM | POA: Diagnosis not present

## 2018-07-27 MED ORDER — MELOXICAM 15 MG PO TABS: 15.0000 mg | ORAL_TABLET | Freq: Every day | ORAL | 0 refills | Status: DC

## 2018-07-27 NOTE — Progress Notes (Signed)
Patient will obtain vitals and report to [facility] at visit.   3 week history of left wrist pain. No injury. Swelling. Has tried wrist brace - no help.

## 2018-07-27 NOTE — Progress Notes (Signed)
Patient ID: Kristen Morris, female   DOB: Apr 05, 1950, 68 y.o.   MRN: [id number] .Kristen KitchenVirtual Visit via Video Note  I connected with Kristen Morris on 07/27/18 at  3:00 PM EDT by a video enabled telemedicine application and verified that I am speaking with the correct person using two identifiers.  Location: Patient: home Provider: home   I discussed the limitations of evaluation and management by telemedicine and the availability of in person appointments. The patient expressed understanding and agreed to proceed.  History of Present Illness: Pt is a 68 yo female with osteoporosis, CKD who calls in with left thumb and wrist pain for 3 weeks. She denies any injury. She has noticed swelling and moving her thumb is very painful. She has tried a wrist splint she had laying around house but does not immobilize thumb and voltaren gel. Neither have been helping.   .. Active Ambulatory Problems    Diagnosis Date Noted  . Depression 05/24/2015  . DDD (degenerative disc disease), lumbar 05/24/2015  . Mixed hyperlipidemia 05/24/2015  . Migraine without aura and with status migrainosus, not intractable 05/24/2015  . Post-menopausal 05/24/2015  . Family history of heart attack 05/24/2015  . Osteoporosis 05/24/2015  . Osteoporosis, 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  . CKD (chronic kidney disease) stage 3, GFR 30-59 ml/min (HCC) 03/02/2016  . Heart palpitations 09/01/2016  . Right hand pain 09/01/2016  . Moderate episode of recurrent major depressive disorder ([place]) 09/02/2017  . Acute right-sided low back pain without sciatica 09/03/2017  . Mid back pain 09/03/2017  . Left lower quadrant pain 09/03/2017  . Cyst of cervix 09/05/2017  . 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 Hidradenitis suppurativa of right axilla 06/12/2018  . De Quervain's tenosynovitis, left 07/27/2018   Resolved Ambulatory Problems    Diagnosis Date Noted  . No Resolved Ambulatory Problems   Past Medical History:   Diagnosis Date  . Hyperlipidemia    Reviewed med, allergy, problem list.    Observations/Objective: No acute distress. Normal mood.  Normal breathing.  Left thumb at [facility] joint appears a little swollen pain with movement of thumb and positive finkelstein.   .. Today's Vitals   07/27/18 1402  BP: 126/84  Pulse: 72  Height: 5\' 2"  (1.575 m)   Body mass index is 25.97 kg/m.    Assessment and Plan:  .Kristen KitchenCharene was seen today for wrist pain.  Diagnoses and all orders for this visit:  De Quervain's tenosynovitis, left -     meloxicam (MOBIC) 15 MG tablet; Take 1 tablet (15 mg total) by mouth daily. With food.   Needs thumb spica.  Continue voltaren gel add mobic daily for 2 weeks. Kidney function was great last check. Not going to be on for long.  Ice 2-3 times a day for 10 minutes.  HO to be mailed to patient.  Follow up if symptoms changing or worsening.    Follow Up Instructions:    I discussed the assessment and treatment plan with the patient. The patient was provided an opportunity to ask questions and all were answered. The patient agreed with the plan and demonstrated an understanding of the instructions.   The patient was advised to call back or seek an in-person evaluation if the symptoms worsen or if the condition fails to improve as anticipated.     Kristen Planas, PA-C

## 2018-08-26 ENCOUNTER — Other Ambulatory Visit: Payer: Self-pay | Admitting: Neurology

## 2018-08-26 DIAGNOSIS — M810 Age-related osteoporosis without current pathological fracture: Secondary | ICD-10-CM

## 2018-08-26 DIAGNOSIS — Z8249 Family history of ischemic heart disease and other diseases of the circulatory system: Secondary | ICD-10-CM

## 2018-08-26 DIAGNOSIS — Z0000 Encounter for general adult medical examination without abnormal findings: Secondary | ICD-10-CM

## 2018-08-26 DIAGNOSIS — E782 Mixed hyperlipidemia: Secondary | ICD-10-CM

## 2018-08-26 NOTE — Progress Notes (Signed)
Patient came in for annual labs. Labs ordered.

## 2018-08-27 LAB — CBC WITH DIFFERENTIAL/PLATELET
Absolute Monocytes: 451 cells/uL (ref 200–950)
Basophils Absolute: 39 cells/uL (ref 0–200)
Basophils Relative: 0.8 %
Eosinophils Absolute: 191 cells/uL (ref 15–500)
Eosinophils Relative: 3.9 %
HCT: 37.2 % (ref 35.0–45.0)
Hemoglobin: 12.4 g/dL (ref 11.7–15.5)
Lymphs Abs: 1651 cells/uL (ref 850–3900)
MCH: 31.5 pg (ref 27.0–33.0)
MCHC: 33.3 g/dL (ref 32.0–36.0)
MCV: 94.4 fL (ref 80.0–100.0)
MPV: 10 fL (ref 7.5–12.5)
Monocytes Relative: 9.2 %
Neutro Abs: 2568 cells/uL (ref 1500–7800)
Neutrophils Relative %: 52.4 %
Platelets: 368 10*3/uL (ref 140–400)
RBC: 3.94 10*6/uL (ref 3.80–5.10)
RDW: 11.6 % (ref 11.0–15.0)
Total Lymphocyte: 33.7 %
WBC: 4.9 10*3/uL (ref 3.8–10.8)

## 2018-08-27 LAB — LIPID PANEL
Cholesterol: 180 mg/dL (ref <200)
HDL: 52 mg/dL (ref >=50)
LDL Cholesterol (Calc): 105 mg/dL (calc) — ABNORMAL HIGH
Non-HDL Cholesterol (Calc): 128 mg/dL (calc) (ref <130)
Total CHOL/HDL Ratio: 3.5 (calc) (ref <5.0)
Triglycerides: 136 mg/dL (ref <150)

## 2018-08-27 LAB — COMPLETE METABOLIC PANEL WITH GFR
AG Ratio: 1.8 (calc) (ref 1.0–2.5)
ALT: 21 U/L (ref 6–29)
AST: 24 U/L (ref 10–35)
Albumin: 3.9 g/dL (ref 3.6–5.1)
Alkaline phosphatase (APISO): 37 U/L (ref 37–153)
BUN: 17 mg/dL (ref 7–25)
CO2: 25 mmol/L (ref 20–32)
Calcium: 9.2 mg/dL (ref 8.6–10.4)
Chloride: 109 mmol/L (ref 98–110)
Creat: 0.99 mg/dL (ref 0.50–0.99)
GFR, Est African American: 68 mL/min/{1.73_m2} (ref >=60)
GFR, Est Non African American: 59 mL/min/{1.73_m2} — ABNORMAL LOW (ref >=60)
Globulin: 2.2 g/dL (calc) (ref 1.9–3.7)
Glucose, Bld: 87 mg/dL (ref 65–99)
Potassium: 4.2 mmol/L (ref 3.5–5.3)
Sodium: 143 mmol/L (ref 135–146)
Total Bilirubin: 0.4 mg/dL (ref 0.2–1.2)
Total Protein: 6.1 g/dL (ref 6.1–8.1)

## 2018-08-27 LAB — TSH: TSH: 3.44 mIU/L (ref 0.40–4.50)

## 2018-08-27 NOTE — Progress Notes (Signed)
Call pt: labs are stable and look good.

## 2018-09-07 ENCOUNTER — Encounter: Payer: Medicare Other | Admitting: Physician Assistant

## 2018-09-09 ENCOUNTER — Ambulatory Visit (INDEPENDENT_AMBULATORY_CARE_PROVIDER_SITE_OTHER): Payer: Medicare Other | Admitting: Physician Assistant

## 2018-09-09 ENCOUNTER — Encounter: Payer: Self-pay | Admitting: Physician Assistant

## 2018-09-09 ENCOUNTER — Other Ambulatory Visit: Payer: Self-pay

## 2018-09-09 VITALS — BP 134/89 | HR 81 | Temp 98.4°F | Ht 62.0 in | Wt 145.0 lb

## 2018-09-09 DIAGNOSIS — Z0000 Encounter for general adult medical examination without abnormal findings: Secondary | ICD-10-CM | POA: Diagnosis not present

## 2018-09-09 DIAGNOSIS — M810 Age-related osteoporosis without current pathological fracture: Secondary | ICD-10-CM | POA: Diagnosis not present

## 2018-09-09 DIAGNOSIS — Z23 Encounter for immunization: Secondary | ICD-10-CM | POA: Diagnosis not present

## 2018-09-09 MED ORDER — AMBULATORY NON FORMULARY MEDICATION: 0 refills | Status: DC

## 2018-09-09 NOTE — Patient Instructions (Signed)
Health Maintenance After Age 68 After age 68, you are at a higher risk for certain long-term diseases and infections as well as injuries from falls. Falls are a major cause of broken bones and head injuries in people who are older than age 68. Getting regular preventive care can help to keep you healthy and well. Preventive care includes getting regular testing and making lifestyle changes as recommended by your health care provider. Talk with your health care provider about:  Which screenings and tests you should have. A screening is a test that checks for a disease when you have no symptoms.  A diet and exercise plan that is right for you. What should I know about screenings and tests to prevent falls? Screening and testing are the best ways to find a health problem early. Early diagnosis and treatment give you the best chance of managing medical conditions that are common after age 68. Certain conditions and lifestyle choices may make you more likely to have a fall. Your health care provider may recommend:  Regular vision checks. Poor vision and conditions such as cataracts can make you more likely to have a fall. If you wear glasses, make sure to get your prescription updated if your vision changes.  Medicine review. Work with your health care provider to regularly review all of the medicines you are taking, including over-the-counter medicines. Ask your health care provider about any side effects that may make you more likely to have a fall. Tell your health care provider if any medicines that you take make you feel dizzy or sleepy.  Osteoporosis screening. Osteoporosis is a condition that causes the bones to get weaker. This can make the bones weak and cause them to break more easily.  Blood pressure screening. Blood pressure changes and medicines to control blood pressure can make you feel dizzy.  Strength and balance checks. Your health care provider may recommend certain tests to check your  strength and balance while standing, walking, or changing positions.  Foot health exam. Foot pain and numbness, as well as not wearing proper footwear, can make you more likely to have a fall.  Depression screening. You may be more likely to have a fall if you have a fear of falling, feel emotionally low, or feel unable to do activities that you used to do.  Alcohol use screening. Using too much alcohol can affect your balance and may make you more likely to have a fall. What actions can I take to lower my risk of falls? General instructions  Talk with your health care provider about your risks for falling. Tell your health care provider if: ? You fall. Be sure to tell your health care provider about all falls, even ones that seem minor. ? You feel dizzy, sleepy, or off-balance.  Take over-the-counter and prescription medicines only as told by your health care provider. These include any supplements.  Eat a healthy diet and maintain a healthy weight. A healthy diet includes low-fat dairy products, low-fat (lean) meats, and fiber from whole grains, beans, and lots of fruits and vegetables. Home safety  Remove any tripping hazards, such as rugs, cords, and clutter.  Install safety equipment such as grab bars in bathrooms and safety rails on stairs.  Keep rooms and walkways well-lit. Activity   Follow a regular exercise program to stay fit. This will help you maintain your balance. Ask your health care provider what types of exercise are appropriate for you.  If you need a cane or   walker, use it as recommended by your health care provider.  Wear supportive shoes that have nonskid soles. Lifestyle  Do not drink alcohol if your health care provider tells you not to drink.  If you drink alcohol, limit how much you have: ? 0-1 drink a day for women. ? 0-2 drinks a day for men.  Be aware of how much alcohol is in your drink. In the [place] one drink equals one typical bottle of beer (12  oz), one-half glass of wine (5 oz), or one shot of hard liquor (1 oz).  Do not use any products that contain nicotine or tobacco, such as cigarettes and e-cigarettes. If you need help quitting, ask your health care provider. Summary  Having a healthy lifestyle and getting preventive care can help to protect your health and wellness after age 68.  Screening and testing are the best way to find a health problem early and help you avoid having a fall. Early diagnosis and treatment give you the best chance for managing medical conditions that are more common for people who are older than age 68.  Falls are a major cause of broken bones and head injuries in people who are older than age 68. Take precautions to prevent a fall at home.  Work with your health care provider to learn what changes you can make to improve your health and wellness and to prevent falls. This information is not intended to replace advice given to you by your health care provider. Make sure you discuss any questions you have with your health care provider. Document Released: 11/06/2016 Document Revised: 04/16/2018 Document Reviewed: 11/06/2016 Elsevier Patient Education  2020 Elsevier Inc.  

## 2018-09-09 NOTE — Progress Notes (Signed)
Subjective:     Kristen Morris is a 68 y.o. female and is here for a comprehensive physical exam. The patient reports no problems.  She feels really good. She goes regularly to eye doctor and dentist.     Social History   Socioeconomic History  . Marital status: Married    Spouse name: Not on file  . Number of children: Not on file  . Years of education: Not on file  . Highest education level: Not on file  Occupational History  . Not on file  Social Needs  . Financial resource strain: Not on file  . Food insecurity    Worry: Not on file    Inability: Not on file  . Transportation needs    Medical: Not on file    Non-medical: Not on file  Tobacco Use  . Smoking status: Never Smoker  . Smokeless tobacco: Never Used  Substance and Sexual Activity  . Alcohol use: No    Alcohol/week: 0.0 standard drinks  . Drug use: No  . Sexual activity: Yes  Lifestyle  . Physical activity    Days per week: Not on file    Minutes per session: Not on file  . Stress: Not on file  Relationships  . Social Musicianconnections    Talks on phone: Not on file    Gets together: Not on file    Attends religious service: Not on file    Active member of club or organization: Not on file    Attends meetings of clubs or organizations: Not on file    Relationship status: Not on file  . Intimate partner violence    Fear of current or ex partner: Not on file    Emotionally abused: Not on file    Physically abused: Not on file    Forced sexual activity: Not on file  Other Topics Concern  . Not on file  Social History Narrative  . Not on file   Health Maintenance  Topic Date Due  . DEXA SCAN  05/23/2017  . INFLUENZA VACCINE  04/07/2019 (Originally 08/08/2018)  . Fecal DNA (Cologuard)  12/12/2018  . MAMMOGRAM  07/01/2019  . TETANUS/TDAP  03/08/2026  . Hepatitis C Screening  Completed  . PNA vac Low Risk Adult  Completed    The following portions of the patient's history were reviewed and updated  as appropriate: allergies, current medications, past family history, past medical history, past social history, past surgical history and problem list.  Review of Systems A comprehensive review of systems was negative.   Objective:    BP 134/89   Pulse 81   Temp 98.4 F (36.9 C) (Oral)   Ht 5\' 2"  (1.575 m)   Wt 145 lb (65.8 kg)   SpO2 98%   BMI 26.52 kg/m  General appearance: alert, cooperative and appears stated age Head: Normocephalic, without obvious abnormality, atraumatic Eyes: conjunctivae/corneas clear. PERRL, EOM's intact. Fundi benign. Ears: normal TM's and external ear canals both ears Nose: Nares normal. Septum midline. Mucosa normal. No drainage or sinus tenderness. Throat: lips, mucosa, and tongue normal; teeth and gums normal Neck: no adenopathy, no carotid bruit, no JVD, supple, symmetrical, trachea midline and thyroid not enlarged, symmetric, no tenderness/mass/nodules Back: symmetric, no curvature. ROM normal. No CVA tenderness. Lungs: clear to auscultation bilaterally Heart: regular rate and rhythm, S1, S2 normal, no murmur, click, rub or gallop Abdomen: soft, non-tender; bowel sounds normal; no masses,  no organomegaly Extremities: extremities normal, atraumatic, no cyanosis or edema  Pulses: 2+ and symmetric Skin: Skin color, texture, turgor normal. No rashes or lesions Lymph nodes: Cervical, supraclavicular, and axillary nodes normal. Neurologic: Alert and oriented X 3, normal strength and tone. Normal symmetric reflexes. Normal coordination and gait    Assessment:    Healthy female exam.      Plan:      .Marland KitchenCelesta was seen today for annual exam.  Diagnoses and all orders for this visit:  Routine physical examination  Age related osteoporosis, unspecified pathological fracture presence -     DG Bone Density  Need for shingles vaccine -     AMBULATORY NON FORMULARY MEDICATION; shingrx 2 doses to prevent shingles.    Depression screen [facility] 2/9  09/09/2018 09/02/2017 08/27/2016  Decreased Interest 0 0 0  Down, Depressed, Hopeless 0 0 0  PHQ - 2 Score 0 0 0  Altered sleeping 2 2 -  Tired, decreased energy 0 1 -  Change in appetite 0 0 -  Feeling bad or failure about yourself  0 0 -  Trouble concentrating 0 0 -  Moving slowly or fidgety/restless 0 0 -  Suicidal thoughts 0 0 -  PHQ-9 Score 2 3 -  Difficult doing work/chores Somewhat difficult Somewhat difficult -   .[name] Discussed 150 minutes of exercise a week.  Encouraged vitamin D 1000 units and Calcium 1300mg  or 4 servings of dairy a day.  Mammogram UTD. No need for pap.  cologuard repeat in 12/2018. Ordered bone density.  Sent rx for shingles vaccines.  pneumonia up to date.  Pt declined flu shot.   Reviewed labs in office.   Discussed better sleep routine. Can use melatonin 3-10mg  before bed and/or unisom or valarian root.   See After Visit Summary for Counseling Recommendations

## 2018-09-30 ENCOUNTER — Other Ambulatory Visit: Payer: Self-pay

## 2018-09-30 ENCOUNTER — Ambulatory Visit (INDEPENDENT_AMBULATORY_CARE_PROVIDER_SITE_OTHER): Payer: Medicare Other

## 2018-09-30 ENCOUNTER — Telehealth: Payer: Self-pay

## 2018-09-30 DIAGNOSIS — M810 Age-related osteoporosis without current pathological fracture: Secondary | ICD-10-CM | POA: Diagnosis not present

## 2018-09-30 NOTE — Progress Notes (Signed)
Yay! Bone density is improving. You are now in the osteopenic range. Low bone mass not osteoporotic. Treatment is working.

## 2018-09-30 NOTE — Telephone Encounter (Signed)
This medication or product is on your plan's list of covered drugs. Prior authorization is not required at this time. If your pharmacy has questions regarding the processing of your prescription, please have them call the [facility] pharmacy help desk at [phone]. **Please note: Formulary lowering, tiering exception, cost reduction and prospective Medicare hospice reviews cannot be requested using this method of submission. Please contact us at [phone] instead  Patient has a nurse visit on 10/14/2018.

## 2018-09-30 NOTE — Telephone Encounter (Signed)
Kristen Morris needs a PA for Prolia. Please call and schedule a nurse visit after PA has been completed.    [name],  It has been a little over a month since her last CMP. Would it be ok to give Prolia?

## 2018-09-30 NOTE — Telephone Encounter (Signed)
Information sent to insurance and waiting on a response.   

## 2018-09-30 NOTE — Telephone Encounter (Signed)
Yes calcium looks good.

## 2018-10-14 ENCOUNTER — Ambulatory Visit (INDEPENDENT_AMBULATORY_CARE_PROVIDER_SITE_OTHER): Payer: Medicare Other | Admitting: Sports Medicine

## 2018-10-14 ENCOUNTER — Other Ambulatory Visit: Payer: Self-pay

## 2018-10-14 VITALS — BP 124/86 | HR 74

## 2018-10-14 DIAGNOSIS — M810 Age-related osteoporosis without current pathological fracture: Secondary | ICD-10-CM

## 2018-10-14 DIAGNOSIS — Z23 Encounter for immunization: Secondary | ICD-10-CM | POA: Diagnosis not present

## 2018-10-14 MED ORDER — DENOSUMAB 60 MG/ML ~~LOC~~ SOSY
60.0000 mg | PREFILLED_SYRINGE | Freq: Once | SUBCUTANEOUS | Status: AC
  Administered 2018-10-14: 60 mg via SUBCUTANEOUS

## 2018-10-14 NOTE — Progress Notes (Signed)
Established Patient Office Visit  Subjective:  Patient ID: Kristen Morris, female    DOB: Apr 01, 1950  Age: 68 y.o. MRN: [id number]  CC:  Chief Complaint  Patient presents with  . Osteoporosis  . Immunizations    HPI Kristen Morris presents for Prolia injection and Flu vaccine. Denies any problems with the injections in the past. See message in telephone message about ok to give injection even though the last CMP was greater than a month ago.   Past Medical History:  Diagnosis Date  . Hyperlipidemia     Past Surgical History:  Procedure Laterality Date  . CESAREAN SECTION      Family History  Problem Relation Age of Onset  . Heart attack Father   . Cancer Brother     Social History   Socioeconomic History  . Marital status: Married    Spouse name: Not on file  . Number of children: Not on file  . Years of education: Not on file  . Highest education level: Not on file  Occupational History  . Not on file  Social Needs  . Financial resource strain: Not on file  . Food insecurity    Worry: Not on file    Inability: Not on file  . Transportation needs    Medical: Not on file    Non-medical: Not on file  Tobacco Use  . Smoking status: Never Smoker  . Smokeless tobacco: Never Used  Substance and Sexual Activity  . Alcohol use: No    Alcohol/week: 0.0 standard drinks  . Drug use: No  . Sexual activity: Yes  Lifestyle  . Physical activity    Days per week: Not on file    Minutes per session: Not on file  . Stress: Not on file  Relationships  . Social Herbalist on phone: Not on file    Gets together: Not on file    Attends religious service: Not on file    Active member of club or organization: Not on file    Attends meetings of clubs or organizations: Not on file    Relationship status: Not on file  . Intimate partner violence    Fear of current or ex partner: Not on file    Emotionally abused: Not on file    Physically abused: Not on  file    Forced sexual activity: Not on file  Other Topics Concern  . Not on file  Social History Narrative  . Not on file    Outpatient Medications Prior to Visit  Medication Sig Dispense Refill  . AMBULATORY NON FORMULARY MEDICATION shingrx 2 doses to prevent shingles. 2 application 0  . Calcium 500 MG tablet Take 600 mg by mouth.    . clobetasol cream (TEMOVATE) 9.23 % Apply 1 application topically 2 (two) times daily. 60 g 2  . denosumab (PROLIA) 60 MG/ML SOLN injection Inject 60 mg into the skin every 6 (six) months. Administer in upper arm, thigh, or abdomen    . lovastatin (MEVACOR) 20 MG tablet Take 1 tablet (20 mg total) by mouth at bedtime. 90 tablet 4  . verapamil (CALAN) 80 MG tablet Take 1 tablet (80 mg total) by mouth 2 (two) times daily. 180 tablet 4   No facility-administered medications prior to visit.     No Known Allergies  ROS Review of Systems    Objective:    Physical Exam  BP 124/86   Pulse 74   SpO2 100%  Wt  Readings from Last 3 Encounters:  09/09/18 145 lb (65.8 kg)  06/10/18 142 lb (64.4 kg)  09/02/17 143 lb (64.9 kg)     There are no preventive care reminders to display for this patient.  There are no preventive care reminders to display for this patient.  Lab Results  Component Value Date   TSH 3.44 08/26/2018   Lab Results  Component Value Date   WBC 4.9 08/26/2018   HGB 12.4 08/26/2018   HCT 37.2 08/26/2018   MCV 94.4 08/26/2018   PLT 368 08/26/2018   Lab Results  Component Value Date   NA 143 08/26/2018   K 4.2 08/26/2018   CO2 25 08/26/2018   GLUCOSE 87 08/26/2018   BUN 17 08/26/2018   CREATININE 0.99 08/26/2018   BILITOT 0.4 08/26/2018   ALKPHOS 35 08/27/2016   AST 24 08/26/2018   ALT 21 08/26/2018   PROT 6.1 08/26/2018   ALBUMIN 3.9 08/27/2016   CALCIUM 9.2 08/26/2018   Lab Results  Component Value Date   CHOL 180 08/26/2018   Lab Results  Component Value Date   HDL 52 08/26/2018   Lab Results  Component  Value Date   LDLCALC 105 (H) 08/26/2018   Lab Results  Component Value Date   TRIG 136 08/26/2018   Lab Results  Component Value Date   CHOLHDL 3.5 08/26/2018   No results found for: HGBA1C    Assessment & Plan:  Osteoporosis - Patient tolerated injection well without complications. Patient advised to schedule next injection 6 months from today.     Problem List Items Addressed This Visit    Osteoporosis - Primary    Other Visit Diagnoses    Need for immunization against influenza       Relevant Orders   Flu Vaccine QUAD High Dose(Fluad) (Completed)      Meds ordered this encounter  Medications  . denosumab (PROLIA) injection 60 mg    Follow-up: Return in about 6 months (around 04/14/2019) for Prolia injection.    Esmond Harps, CMA

## 2018-12-09 ENCOUNTER — Ambulatory Visit (INDEPENDENT_AMBULATORY_CARE_PROVIDER_SITE_OTHER): Payer: Medicare Other | Admitting: Sports Medicine

## 2018-12-09 ENCOUNTER — Encounter: Payer: Self-pay | Admitting: Sports Medicine

## 2018-12-09 ENCOUNTER — Other Ambulatory Visit: Payer: Self-pay

## 2018-12-09 DIAGNOSIS — M654 Radial styloid tenosynovitis [de Quervain]: Secondary | ICD-10-CM | POA: Diagnosis not present

## 2018-12-09 NOTE — Assessment & Plan Note (Signed)
Failed conservative measures, first extensor compartment injection as above, de Quervain's exercises given, return in a month.

## 2018-12-09 NOTE — Progress Notes (Signed)
Subjective:    CC: Left wrist pain  HPI: Kristen Morris is a pleasant 68 year old female, for a long time now she is had pain in her left wrist, dorsal aspect, diagnosed clinically as a de Quervain's tendinitis, she tried conservative measures including NSAIDs and rehab exercises without any improvement, pain is severe, persistent, localized over the first extensor compartment without radiation.  I reviewed the past medical history, family history, social history, surgical history, and allergies today and no changes were needed.  Please see the problem list section below in epic for further details.  Past Medical History: Past Medical History:  Diagnosis Date  . Hyperlipidemia    Past Surgical History: Past Surgical History:  Procedure Laterality Date  . CESAREAN SECTION     Social History: Social History   Socioeconomic History  . Marital status: Married    Spouse name: Not on file  . Number of children: Not on file  . Years of education: Not on file  . Highest education level: Not on file  Occupational History  . Not on file  Social Needs  . Financial resource strain: Not on file  . Food insecurity    Worry: Not on file    Inability: Not on file  . Transportation needs    Medical: Not on file    Non-medical: Not on file  Tobacco Use  . Smoking status: Never Smoker  . Smokeless tobacco: Never Used  Substance and Sexual Activity  . Alcohol use: No    Alcohol/week: 0.0 standard drinks  . Drug use: No  . Sexual activity: Yes  Lifestyle  . Physical activity    Days per week: Not on file    Minutes per session: Not on file  . Stress: Not on file  Relationships  . Social Musician on phone: Not on file    Gets together: Not on file    Attends religious service: Not on file    Active member of club or organization: Not on file    Attends meetings of clubs or organizations: Not on file    Relationship status: Not on file  Other Topics Concern  . Not on file   Social History Narrative  . Not on file   Family History: Family History  Problem Relation Age of Onset  . Heart attack Father   . Cancer Brother    Allergies: No Known Allergies Medications: See med rec.  Review of Systems: No fevers, chills, night sweats, weight loss, chest pain, or shortness of breath.   Objective:    General: Well Developed, well nourished, and in no acute distress.  Neuro: Alert and oriented x3, extra-ocular muscles intact, sensation grossly intact.  HEENT: Normocephalic, atraumatic, pupils equal round reactive to light, neck supple, no masses, no lymphadenopathy, thyroid nonpalpable.  Skin: Warm and dry, no rashes. Cardiac: Regular rate and rhythm, no murmurs rubs or gallops, no lower extremity edema.  Respiratory: Clear to auscultation bilaterally. Not using accessory muscles, speaking in full sentences. Left wrist: Swelling over the first extensor compartment ROM smooth and normal with good flexion and extension and ulnar/radial deviation that is symmetrical with opposite wrist. Palpation is normal over metacarpals, navicular, lunate, and TFCC; tendons without tenderness/ swelling No snuffbox tenderness. No tenderness over Canal of [place]. Strength 5/5 in all directions without pain. Negative tinel's and phalens signs. Positive Finkelstein sign. Negative Watson's test.  Procedure: Real-time Ultrasound Guided injection of the left first extensor compartment Device: Samsung HS60  Verbal informed  consent obtained.  Time-out conducted.  Noted no overlying erythema, induration, or other signs of local infection.  Skin prepped in a sterile fashion.  Local anesthesia: Topical Ethyl chloride.  With sterile technique and under real time ultrasound guidance: 1 cc Kenalog 40, 1 cc lidocaine, 1 cc bupivacaine injected easily Completed without difficulty  Pain immediately resolved suggesting accurate placement of the medication.  Advised to call if  fevers/chills, erythema, induration, drainage, or persistent bleeding.  Images permanently stored and available for review in the ultrasound unit.  Impression: Technically successful ultrasound guided injection.  Impression and Recommendations:    De Quervain's tenosynovitis, left Failed conservative measures, first extensor compartment injection as above, de Quervain's exercises given, return in a month.   ___________________________________________ Gwen Her. Dianah Field, M.D., ABFM., CAQSM. Primary Care and Sports Medicine [facility]  Adjunct Professor of [employer]

## 2019-01-06 ENCOUNTER — Ambulatory Visit: Payer: Medicare Other | Admitting: Sports Medicine

## 2019-02-04 ENCOUNTER — Other Ambulatory Visit: Payer: Self-pay | Admitting: Physician Assistant

## 2019-02-04 DIAGNOSIS — E782 Mixed hyperlipidemia: Secondary | ICD-10-CM

## 2019-02-04 DIAGNOSIS — G43001 Migraine without aura, not intractable, with status migrainosus: Secondary | ICD-10-CM

## 2019-03-05 ENCOUNTER — Ambulatory Visit: Payer: Medicare Other | Attending: Internal Medicine

## 2019-03-05 DIAGNOSIS — Z23 Encounter for immunization: Secondary | ICD-10-CM | POA: Insufficient documentation

## 2019-03-05 NOTE — Progress Notes (Signed)
   Covid-19 Vaccination Clinic  Name:  Ryonna Cimini    MRN: [id number] DOB: [birth year]  03/05/2019  Ms. Gunther was observed post Covid-19 immunization for 15 minutes without incidence. She was provided with Vaccine Information Sheet and instruction to access the V-Safe system.   Ms. Morro was instructed to call 911 with any severe reactions post vaccine: [name] Difficulty breathing  . Swelling of your face and throat  . A fast heartbeat  . A bad rash all over your body  . Dizziness and weakness    Immunizations Administered    Name Date Dose VIS Date Route   Pfizer COVID-19 Vaccine 03/05/2019  1:03 PM 0.3 mL 12/18/2018 Intramuscular   Manufacturer: [employer], [employer]   Lot: [id number]   NDC: [address]-2

## 2019-03-31 ENCOUNTER — Ambulatory Visit: Payer: Medicare Other | Attending: Internal Medicine

## 2019-03-31 DIAGNOSIS — Z23 Encounter for immunization: Secondary | ICD-10-CM

## 2019-03-31 NOTE — Progress Notes (Signed)
   Covid-19 Vaccination Clinic  Name:  Kristen Morris    MRN: [id number] DOB: [birth year]  03/31/2019  Kristen Morris was observed post Covid-19 immunization for 15 minutes without incident. She was provided with Vaccine Information Sheet and instruction to access the V-Safe system.   Kristen Morris was instructed to call 911 with any severe reactions post vaccine: [name] Difficulty breathing  . Swelling of face and throat  . A fast heartbeat  . A bad rash all over body  . Dizziness and weakness   Immunizations Administered    Name Date Dose VIS Date Route   Pfizer COVID-19 Vaccine 03/31/2019  9:45 AM 0.3 mL 12/18/2018 Intramuscular   Manufacturer: [employer], [employer]   Lot: [id number]   NDC: [address]-0

## 2019-04-14 ENCOUNTER — Telehealth: Payer: Self-pay | Admitting: Physician Assistant

## 2019-04-14 ENCOUNTER — Ambulatory Visit: Payer: Medicare Other

## 2019-04-14 ENCOUNTER — Other Ambulatory Visit: Payer: Self-pay

## 2019-04-14 DIAGNOSIS — M810 Age-related osteoporosis without current pathological fracture: Secondary | ICD-10-CM

## 2019-04-14 NOTE — Telephone Encounter (Signed)
Ok thanks. Can we put her on a list somewhere to call when shot comes in?

## 2019-04-14 NOTE — Telephone Encounter (Signed)
I placed order for this today and we should receive this either tomorrow of Friday.   You can go ahead and reschedule patient for next week

## 2019-04-14 NOTE — Telephone Encounter (Signed)
Patient was on the schedule for a Prolia shot and we did not have this in the office. I advised her that we would call once the prolia shot came in the office. She wants to know why we could not call her later. FYI.

## 2019-04-16 NOTE — Telephone Encounter (Signed)
Patient left vm for call back x 2. I tried to call and number busy.

## 2019-04-16 NOTE — Telephone Encounter (Signed)
I have called the patient and the number keeps giving a busy signal. Can you call to set up a prolia injection?

## 2019-04-19 ENCOUNTER — Telehealth: Payer: Self-pay | Admitting: Neurology

## 2019-04-19 NOTE — Telephone Encounter (Signed)
Patient left vm. She wants a letter to be excused from [employer]. She has been called for May 12. She states "my nerves won't take it". She has a mother in a nursing home in [place]. She has power of attorney and she isn't doing well. Travels there twice weekly. Okay to write note?   If okay she wants me to send via mail. If not okay I told her I would call and let her know.

## 2019-04-19 NOTE — Telephone Encounter (Signed)
Yes ok for note.

## 2019-04-19 NOTE — Telephone Encounter (Signed)
Left patient a voicemail with information below. Let patient know to call us back to schedule an appointment. 

## 2019-04-19 NOTE — Telephone Encounter (Signed)
Agree with plan 

## 2019-04-19 NOTE — Telephone Encounter (Signed)
Letter written and mailed to patient.  

## 2019-04-19 NOTE — Telephone Encounter (Signed)
Kristen Morris needs labs before Prolia injection. Labs ordered. Patient has been scheduled. I will call for a PA.

## 2019-04-20 LAB — COMPLETE METABOLIC PANEL WITH GFR
AG Ratio: 1.9 (calc) (ref 1.0–2.5)
ALT: 20 U/L (ref 6–29)
AST: 24 U/L (ref 10–35)
Albumin: 4.1 g/dL (ref 3.6–5.1)
Alkaline phosphatase (APISO): 34 U/L — ABNORMAL LOW (ref 37–153)
BUN: 22 mg/dL (ref 7–25)
CO2: 26 mmol/L (ref 20–32)
Calcium: 9.4 mg/dL (ref 8.6–10.4)
Chloride: 107 mmol/L (ref 98–110)
Creat: 0.95 mg/dL (ref 0.50–0.99)
GFR, Est African American: 71 mL/min/{1.73_m2} (ref >=60)
GFR, Est Non African American: 62 mL/min/{1.73_m2} (ref >=60)
Globulin: 2.2 g/dL (calc) (ref 1.9–3.7)
Glucose, Bld: 82 mg/dL (ref 65–99)
Potassium: 4.2 mmol/L (ref 3.5–5.3)
Sodium: 140 mmol/L (ref 135–146)
Total Bilirubin: 0.4 mg/dL (ref 0.2–1.2)
Total Protein: 6.3 g/dL (ref 6.1–8.1)

## 2019-04-21 NOTE — Telephone Encounter (Signed)
No PA required for Prolia Ref # [id number] for the call. Patient has already been scheduled.

## 2019-04-21 NOTE — Telephone Encounter (Signed)
Ok for prolia

## 2019-04-28 ENCOUNTER — Other Ambulatory Visit: Payer: Self-pay

## 2019-04-28 ENCOUNTER — Ambulatory Visit (INDEPENDENT_AMBULATORY_CARE_PROVIDER_SITE_OTHER): Payer: Medicare Other | Admitting: Physician Assistant

## 2019-04-28 VITALS — BP 121/91 | HR 79 | Temp 97.8°F

## 2019-04-28 DIAGNOSIS — M810 Age-related osteoporosis without current pathological fracture: Secondary | ICD-10-CM

## 2019-04-28 MED ORDER — DENOSUMAB 60 MG/ML ~~LOC~~ SOSY
60.0000 mg | PREFILLED_SYRINGE | Freq: Once | SUBCUTANEOUS | Status: AC
  Administered 2019-04-28: 60 mg via SUBCUTANEOUS

## 2019-04-28 NOTE — Progress Notes (Signed)
Patient presents today as a nurse visit for Prolia 60 mg/1 ml injection. Patient is scheduled to get this injection every 6 months. Patients last injection was given on 10/14/2018 in the left arm. Patients last CMP was done on 04/20/2019.  Patient denies falls, CP, palpitations, ShOB, dizziness/lightheadedness, abdominal pain, headache, and mood swings.   Injection given in right arm. Pt tolerated injection well without complications. Pt instructed to schedule a nurse visit for the next injection that will be due in 6 months with lab work to be done 1 week prior.

## 2019-05-26 ENCOUNTER — Other Ambulatory Visit: Payer: Self-pay | Admitting: Physician Assistant

## 2019-05-26 DIAGNOSIS — Z1231 Encounter for screening mammogram for malignant neoplasm of breast: Secondary | ICD-10-CM

## 2019-07-07 ENCOUNTER — Other Ambulatory Visit: Payer: Self-pay

## 2019-07-07 ENCOUNTER — Ambulatory Visit (INDEPENDENT_AMBULATORY_CARE_PROVIDER_SITE_OTHER): Payer: Medicare Other

## 2019-07-07 DIAGNOSIS — Z1231 Encounter for screening mammogram for malignant neoplasm of breast: Secondary | ICD-10-CM

## 2019-07-09 NOTE — Progress Notes (Signed)
Call pt: normal mammogram. Follow up in 1 year.

## 2019-08-12 ENCOUNTER — Encounter: Payer: Self-pay | Admitting: Family Medicine

## 2019-08-12 ENCOUNTER — Ambulatory Visit: Payer: Medicare Other | Admitting: Family Medicine

## 2019-08-12 ENCOUNTER — Other Ambulatory Visit: Payer: Self-pay

## 2019-08-12 ENCOUNTER — Ambulatory Visit: Payer: Self-pay

## 2019-08-12 VITALS — BP 110/80 | HR 88 | Ht 62.0 in | Wt 138.2 lb

## 2019-08-12 DIAGNOSIS — M654 Radial styloid tenosynovitis [de Quervain]: Secondary | ICD-10-CM | POA: Diagnosis not present

## 2019-08-12 NOTE — Progress Notes (Signed)
    Subjective:    CC: L wrist pain  I, Kristen Morris, LAT, ATC, am serving as scribe for Dr. Clementeen Morris.  HPI: Pt is a 69 y/o female presenting w/ c/o chronic L wrist pain.  She was previously seen by Dr. Benjamin Stain and had a L 1st compartment injection on 12/09/18.  She has tried rehab, NSAIDs in the past w/ no relief.  She locates her pain to her L dorsal, radial wrist.  Her pain has been flared up for about 2-3 weeks.  L wrist swelling: No L wrist mechanical symptoms: No Aggravating factors: Picking up anything; gripping;  Treatments tried: prior L wrist injection (see above), HEP  Pertinent review of Systems: No fevers or chills  Relevant historical information: History of migraine   Objective:    Vitals:   08/12/19 0950  BP: 110/80  Pulse: 88  SpO2: 96%   General: Well Developed, well nourished, and in no acute distress.   MSK: Left wrist normal-appearing tender palpation radial styloid.  Normal wrist motion.  Positive Finkelstein's test.  Lab and Radiology Results  Procedure: Real-time Ultrasound Guided Injection of left wrist first dorsal wrist compartment Device: Philips Affiniti 50G Images permanently stored and available for review in the ultrasound unit. Verbal informed consent obtained.  Discussed risks and benefits of procedure. Warned about infection bleeding damage to structures skin hypopigmentation and fat atrophy among others. Patient expresses understanding and agreement Time-out conducted.   Noted no overlying erythema, induration, or other signs of local infection.   Skin prepped in a sterile fashion.   Local anesthesia: Topical Ethyl chloride.   With sterile technique and under real time ultrasound guidance:  40 mg of Kenalog and 1 mL of  lidocaine injected easily.   Completed without difficulty   Pain immediately resolved suggesting accurate placement of the medication.   Advised to call if fevers/chills, erythema, induration, drainage, or  persistent bleeding.   Images permanently stored and available for review in the ultrasound unit.  Impression: Technically successful ultrasound guided injection.     Impression and Recommendations:    Assessment and Plan: 69 y.o. female with left wrist pain due to de Quervain's tenosynovitis.  Patient had injection about 7 or 8 months ago with return of pain.  Plan for repeat injection today and trial of limited physical therapy.  Recommend use of her thumb spica splint for a few weeks after the shot.  If her pain returns again she may be a good candidate for hand surgery referral..   Orders Placed This Encounter  Procedures  . [place] LIMITED JOINT SPACE STRUCTURES UP LEFT(NO LINKED CHARGES)    Order Specific Question:   Reason for Exam (SYMPTOM  OR DIAGNOSIS REQUIRED)    Answer:   L deQuervain's    Order Specific Question:   Preferred imaging location?    Answer:   Adult nurse [facility]  . Ambulatory referral to Physical Therapy    Referral Priority:   Routine    Referral Type:   Physical Medicine    Referral Reason:   Specialty Services Required    Requested Specialty:   Physical Therapy   No orders of the defined types were placed in this encounter.   Discussed warning signs or symptoms. Please see discharge instructions. Patient expresses understanding.   The above documentation has been reviewed and is accurate and complete Kristen Morris, M.D.

## 2019-08-12 NOTE — Patient Instructions (Addendum)
You had a L wrist injection today.  Call or go to the ER if you develop a large red swollen joint with extreme pain or oozing puss.   Please use the thumb spica wrist splint with heavy activity for a few weeks.   Plan for limited physical therapy.   OK to also use voltaren gel over the counter on the wrist.   Recheck or contact me as needed.

## 2019-08-13 ENCOUNTER — Telehealth: Payer: Self-pay | Admitting: Neurology

## 2019-08-13 DIAGNOSIS — E782 Mixed hyperlipidemia: Secondary | ICD-10-CM

## 2019-08-13 DIAGNOSIS — G43001 Migraine without aura, not intractable, with status migrainosus: Secondary | ICD-10-CM

## 2019-08-13 NOTE — Telephone Encounter (Signed)
Patient left a vm stating that she needs refills of Verapamil, Lovastatin, and Citalopram.   She states she is going out of town on August 23 for 3-4 weeks and needs enough medication to last.   It does look like a year supply was sent in January to [facility] so she should have enough refills of two medications. Citalopram last written in 2019, no longer prescribed here.   Left message on machine for patient to call back to discuss.

## 2019-08-16 MED ORDER — LOVASTATIN 20 MG PO TABS: 20.0000 mg | ORAL_TABLET | Freq: Every day | ORAL | 0 refills | Status: DC

## 2019-08-16 MED ORDER — VERAPAMIL HCL 80 MG PO TABS: 80.0000 mg | ORAL_TABLET | Freq: Two times a day (BID) | ORAL | 0 refills | Status: DC

## 2019-08-16 NOTE — Telephone Encounter (Signed)
Spoke with patient. She is in between doses to get a mail order supply but will run out while she is out of town. Sent 30 day supply to local pharmacy of Lovastatin and Verapamil.   She states she restarted Citalopram on her own. Hasn't been written since 2019. Made her aware she needs appt to go over medication before we refill that.

## 2019-08-20 ENCOUNTER — Other Ambulatory Visit: Payer: Self-pay

## 2019-08-20 ENCOUNTER — Encounter: Payer: Self-pay | Admitting: Rehabilitative and Restorative Service Providers"

## 2019-08-20 ENCOUNTER — Ambulatory Visit (INDEPENDENT_AMBULATORY_CARE_PROVIDER_SITE_OTHER): Payer: Medicare Other | Admitting: Rehabilitative and Restorative Service Providers"

## 2019-08-20 DIAGNOSIS — M25532 Pain in left wrist: Secondary | ICD-10-CM | POA: Diagnosis not present

## 2019-08-20 DIAGNOSIS — R29898 Other symptoms and signs involving the musculoskeletal system: Secondary | ICD-10-CM | POA: Diagnosis not present

## 2019-08-20 DIAGNOSIS — M654 Radial styloid tenosynovitis [de Quervain]: Secondary | ICD-10-CM | POA: Diagnosis not present

## 2019-08-20 NOTE — Therapy (Addendum)
Kristen Morris, [place], [address] Phone: [phone]   Fax:  [phone]  Physical Therapy Evaluation  Patient Details  Name: Kristen Morris MRN: [id number] Date of Birth: 05-09-1950 Referring Provider (PT): Dr Lynne Leader   Encounter Date: 08/20/2019   PT End of Session - 08/20/19 1247    Visit Number 1    Number of Visits 4    Date for PT Re-Evaluation 10/01/19    PT Start Time 1100    PT Stop Time 1143    PT Time Calculation (min) 43 min    Activity Tolerance Patient tolerated treatment well           Past Medical History:  Diagnosis Date  . Hyperlipidemia     Past Surgical History:  Procedure Laterality Date  . CESAREAN SECTION      There were no vitals filed for this visit.    Subjective Assessment - 08/20/19 1105    Subjective Patient reports that she has had pain in the Lt wrist and hand for ~ one year. She was seen 12/20 for injection which helped. She has had returned symptoms have returned in the past 2 months.    Pertinent History LBP    Patient Stated Goals decrease pain in the wrist and use arm and hand for functional activities    Currently in Pain? Yes    Pain Score 2     Pain Location Wrist    Pain Orientation Left    Pain Descriptors / Indicators Sharp    Pain Type Chronic pain    Pain Onset More than a month ago    Pain Frequency Intermittent    Aggravating Factors  moving; lifting; pushing; pulling; holding items              [facility] PT Assessment - 08/20/19 0001      Assessment   Medical Diagnosis DeQuervain's tenosynovitis; chronic wrist pain     Referring Provider (PT) Dr Lynne Leader    Onset Date/Surgical Date 06/08/18    Hand Dominance Right    Next MD Visit PRN     Prior Therapy here for LBP       Precautions   Precautions None      Restrictions   Weight Bearing Restrictions No      Balance Screen   Has the patient fallen in the past 6 months  No    Has the patient had a decrease in activity level because of a fear of falling?  No    Is the patient reluctant to leave their home because of a fear of falling?  No      Home Ecologist residence    Living Arrangements Spouse/significant other      Prior Function   Level of [place] Retired    Leisure household chores; sees mom who is in an ECF; cares for grandson several days a week       Observation/Other Assessments   Focus on Therapeutic Outcomes (FOTO)  63% limitation       Sensation   Additional Comments WFL's per pt report       Posture/Postural Control   Posture Comments head forward; shoulders rounded       AROM   Overall AROM Comments shoulder; elbow; forearm; wrist; hand and thumb ROM all WNL's - patient has pain with abduction of Lt thumb to palmar crease  Strength   Overall Strength Comments WFL's bilat UE's pain with resisted Lt wrist radial deviation and Lt thumb abduction       Palpation   Palpation comment significant tenderness to palpation over the radial wrist to distal radial forearm along abductor polloicis longus and extensor pollicis brevis tendons                       Objective measurements completed on examination: See above findings.       [facility] Adult PT Treatment/Exercise - 08/20/19 0001      Self-Care   Self-Care --   education re activities to avoid for wrist and hand     Wrist Exercises   Other wrist exercises very gentle stretch for abductor pollicis longus avoiding any pain 15 sec hold x 3 reps       Iontophoresis   Type of Iontophoresis Dexamethasone    Location Lt radial wrist     Dose 120 mAmp dose; 1 cc     Time 8 hours                   PT Education - 08/20/19 1147    Education Details HEP POC Ionto Kineso tape    Person(s) Educated Patient    Methods Explanation;Demonstration;Tactile cues;Verbal cues;Handout    Comprehension  Verbalized understanding;Returned demonstration;Verbal cues required;Tactile cues required               PT Long Term Goals - 08/20/19 1256      PT LONG TERM GOAL #1   Title Decrease pain in the Lt wrist and hand by 75-100% allowing patient to return to all normal functional activities    Time 6    Period Weeks    Status New    Target Date 10/01/19      PT LONG TERM GOAL #2   Title Minimal to no pain with palpation along the radial Lt wrist    Time 6    Period Weeks    Status New    Target Date 10/01/19      PT LONG TERM GOAL #3   Title Patient to report return to normal functional activities with use of Lt UE with no pain    Time 6    Period Weeks    Status New    Target Date 10/01/19      PT LONG TERM GOAL #4   Title Independent in HEP    Time 6    Period Weeks    Status New    Target Date 10/01/19      PT LONG TERM GOAL #5   Title Improve FOTO to </= 48% limitation    Time 6    Period Weeks    Status New    Target Date 10/01/19                  Plan - 08/20/19 1248    Clinical Impression Statement [name] presents with recurrent Lt wrist pain with initial incident occuring ~ 1 year ago. Symptoms resolved with injection 12/20 but she has begun to have painin the Lt wrist area again in the past two months. She has received a second injection with some improvement. She is also in a wrist/hand brace. Patient has normal ROM and good strength Lt UE. She has pain with palpation along the radial Lt wrist and pain with full adduction of Lt thumb into palm. Patient has limited funcitonal activities due to  pain in the wrist and hand. She will be on vacation for the next month. Advised to continue wering brace and perform very gentle stretching for thumb. Signs and symptoms are consistent with DeQuervain's tenosynovitis. Patient was instructed in activities to avoid (symptoms started when she began keeping her grandson lifting him with open hand under his arms) Discussed  modifications for lifting. Patient may benefit from instruction in use of kinesotape as she weans from brace. She will schedule for attitional appointment when she returns from vacation as needed.    Rehab Potential Good    PT Frequency 1x / week    PT Duration 6 weeks    PT Treatment/Interventions ADLs/Self Care Home Management;Cryotherapy;Electrical Stimulation;Iontophoresis 67m/ml Dexamethasone;Moist Heat;Ultrasound;Therapeutic activities;Therapeutic exercise;Patient/family education;Manual techniques;Dry needling;Taping    PT Next Visit Plan check and progress exercises as indicated; assess response to ionto; instruct in taping as needed; modalities as indicated    PT Home Exercise Plan [email or web address]    Consulted and Agree with Plan of Care Patient           Patient will benefit from skilled therapeutic intervention in order to improve the following deficits and impairments:     Visit Diagnosis: Wrist pain, acute, left - Plan: PT plan of care cert/re-cert  Tendinitis, de Quervain's - Plan: PT plan of care cert/re-cert  Other symptoms and signs involving the musculoskeletal system - Plan: PT plan of care cert/re-cert     Problem List Patient Active Problem List   Diagnosis Date Noted  . De Quervain's tenosynovitis, left 07/27/2018  . Hidradenitis suppurativa of right axilla 06/12/2018  . Cyst of cervix 09/05/2017  . 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 Acute right-sided low back pain without sciatica 09/03/2017  . Mid back pain 09/03/2017  . Left lower quadrant pain 09/03/2017  . Moderate episode of recurrent major depressive disorder (HOlpe 09/02/2017  . Heart palpitations 09/01/2016  . Right hand pain 09/01/2016  . CKD (chronic kidney disease) stage 3, GFR 30-59 ml/min 03/02/2016  . Osteoporosis, 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  . Depression 05/24/2015  . DDD (degenerative disc disease), lumbar 05/24/2015  . Mixed hyperlipidemia 05/24/2015  . Migraine without aura and  with status migrainosus, not intractable 05/24/2015  . Post-menopausal 05/24/2015  . Family history of heart attack 05/24/2015  . Osteoporosis 05/24/2015    Kristen Morris PNilda SimmerPT, MPH  08/20/2019, 1:01 PM  [facility] [address] [address]Phone: [phone]  Fax:  [phone] Name: Kristen LatinoMRN: [id number] of Birth: [birth year]PHYSICAL THERAPY DISCHARGE SUMMARY  Visits from Start of Care: Evaluation only   Current functional level related to goals / functional outcomes: See evaluation    Remaining deficits: Unknown    Education / Equipment: HEP  Plan: Patient agrees to discharge.  Patient goals were partially met. Patient is being discharged due to not returning since the last visit.  ?????     Kristen Morris P. HHelene KelpPT, MPH 10/15/19 1:50 PM

## 2019-08-20 NOTE — Patient Instructions (Signed)
Access Code: [email or web address]: [email or web address]: 08/13/2021Prepared by: Viaan Knippenberg HoltExercises  Seated Composite Thumb Flexion PROM - 2 x daily - 7 x weekly - 1 sets - 3 reps - 15-20 sec hold Patient Education  [place] Patient Instructions  Kinesiology tape

## 2019-09-24 ENCOUNTER — Encounter: Payer: Medicare Other | Admitting: Rehabilitative and Restorative Service Providers"

## 2019-10-26 ENCOUNTER — Telehealth: Payer: Self-pay | Admitting: Physician Assistant

## 2019-10-26 DIAGNOSIS — Z1329 Encounter for screening for other suspected endocrine disorder: Secondary | ICD-10-CM

## 2019-10-26 DIAGNOSIS — Z0000 Encounter for general adult medical examination without abnormal findings: Secondary | ICD-10-CM

## 2019-10-26 DIAGNOSIS — E782 Mixed hyperlipidemia: Secondary | ICD-10-CM

## 2019-10-26 NOTE — Telephone Encounter (Signed)
Signed.

## 2019-10-26 NOTE — Telephone Encounter (Signed)
Would like labs ordered before she comes in for physical on 11/09.

## 2019-10-26 NOTE — Telephone Encounter (Signed)
Patient advised.

## 2019-10-26 NOTE — Telephone Encounter (Signed)
Labs pended, please review and sign if oK

## 2019-10-28 ENCOUNTER — Ambulatory Visit: Payer: Medicare Other

## 2019-10-30 LAB — COMPLETE METABOLIC PANEL WITH GFR
AG Ratio: 2.2 (calc) (ref 1.0–2.5)
ALT: 21 U/L (ref 6–29)
AST: 21 U/L (ref 10–35)
Albumin: 4.1 g/dL (ref 3.6–5.1)
Alkaline phosphatase (APISO): 29 U/L — ABNORMAL LOW (ref 37–153)
BUN/Creatinine Ratio: 16 (calc) (ref 6–22)
BUN: 17 mg/dL (ref 7–25)
CO2: 27 mmol/L (ref 20–32)
Calcium: 9.4 mg/dL (ref 8.6–10.4)
Chloride: 107 mmol/L (ref 98–110)
Creat: 1.07 mg/dL — ABNORMAL HIGH (ref 0.50–0.99)
GFR, Est African American: 61 mL/min/{1.73_m2} (ref >=60)
GFR, Est Non African American: 53 mL/min/{1.73_m2} — ABNORMAL LOW (ref >=60)
Globulin: 1.9 g/dL (calc) (ref 1.9–3.7)
Glucose, Bld: 88 mg/dL (ref 65–99)
Potassium: 4.1 mmol/L (ref 3.5–5.3)
Sodium: 142 mmol/L (ref 135–146)
Total Bilirubin: 0.5 mg/dL (ref 0.2–1.2)
Total Protein: 6 g/dL — ABNORMAL LOW (ref 6.1–8.1)

## 2019-10-30 LAB — CBC WITH DIFFERENTIAL/PLATELET
Absolute Monocytes: 519 cells/uL (ref 200–950)
Basophils Absolute: 58 cells/uL (ref 0–200)
Basophils Relative: 1.1 %
Eosinophils Absolute: 180 cells/uL (ref 15–500)
Eosinophils Relative: 3.4 %
HCT: 39.8 % (ref 35.0–45.0)
Hemoglobin: 13.1 g/dL (ref 11.7–15.5)
Lymphs Abs: 2094 cells/uL (ref 850–3900)
MCH: 31.7 pg (ref 27.0–33.0)
MCHC: 32.9 g/dL (ref 32.0–36.0)
MCV: 96.4 fL (ref 80.0–100.0)
MPV: 10.1 fL (ref 7.5–12.5)
Monocytes Relative: 9.8 %
Neutro Abs: 2449 cells/uL (ref 1500–7800)
Neutrophils Relative %: 46.2 %
Platelets: 333 10*3/uL (ref 140–400)
RBC: 4.13 10*6/uL (ref 3.80–5.10)
RDW: 11.6 % (ref 11.0–15.0)
Total Lymphocyte: 39.5 %
WBC: 5.3 10*3/uL (ref 3.8–10.8)

## 2019-10-30 LAB — LIPID PANEL W/REFLEX DIRECT LDL
Cholesterol: 182 mg/dL (ref <200)
HDL: 58 mg/dL (ref >=50)
LDL Cholesterol (Calc): 104 mg/dL (calc) — ABNORMAL HIGH
Non-HDL Cholesterol (Calc): 124 mg/dL (calc) (ref <130)
Total CHOL/HDL Ratio: 3.1 (calc) (ref <5.0)
Triglycerides: 105 mg/dL (ref <150)

## 2019-10-30 LAB — TSH: TSH: 2.56 mIU/L (ref 0.40–4.50)

## 2019-11-01 NOTE — Telephone Encounter (Signed)
Yes ok for prolia

## 2019-11-01 NOTE — Telephone Encounter (Signed)
Call pt: thyroid stable. No anemia. Cholesterol looks good and stable from 1 year ago.  Kidney function  and protein is down just a bit from 6 months ago. We can discuss at upcoming appt.  Your alk phos is low. I would like to check zinc and magnesium. At times protein in diet can effect this. Consider meal replacement shakes with protein or increasing protein in diet. Will monitor this and recheck hepatic panel in 6 weeks.

## 2019-11-16 ENCOUNTER — Other Ambulatory Visit: Payer: Self-pay

## 2019-11-16 ENCOUNTER — Ambulatory Visit (INDEPENDENT_AMBULATORY_CARE_PROVIDER_SITE_OTHER): Payer: Medicare Other | Admitting: Physician Assistant

## 2019-11-16 ENCOUNTER — Telehealth: Payer: Self-pay | Admitting: Neurology

## 2019-11-16 VITALS — BP 117/70 | HR 84 | Ht 62.0 in | Wt 136.0 lb

## 2019-11-16 DIAGNOSIS — R944 Abnormal results of kidney function studies: Secondary | ICD-10-CM

## 2019-11-16 DIAGNOSIS — R748 Abnormal levels of other serum enzymes: Secondary | ICD-10-CM

## 2019-11-16 DIAGNOSIS — M810 Age-related osteoporosis without current pathological fracture: Secondary | ICD-10-CM

## 2019-11-16 DIAGNOSIS — G43001 Migraine without aura, not intractable, with status migrainosus: Secondary | ICD-10-CM

## 2019-11-16 DIAGNOSIS — E782 Mixed hyperlipidemia: Secondary | ICD-10-CM

## 2019-11-16 DIAGNOSIS — Z0000 Encounter for general adult medical examination without abnormal findings: Secondary | ICD-10-CM

## 2019-11-16 DIAGNOSIS — Z1211 Encounter for screening for malignant neoplasm of colon: Secondary | ICD-10-CM

## 2019-11-16 DIAGNOSIS — Z23 Encounter for immunization: Secondary | ICD-10-CM | POA: Diagnosis not present

## 2019-11-16 MED ORDER — AMBULATORY NON FORMULARY MEDICATION: 0 refills | Status: DC

## 2019-11-16 MED ORDER — VERAPAMIL HCL 80 MG PO TABS: 80.0000 mg | ORAL_TABLET | Freq: Two times a day (BID) | ORAL | 3 refills | Status: DC

## 2019-11-16 MED ORDER — LOVASTATIN 20 MG PO TABS: 20.0000 mg | ORAL_TABLET | Freq: Every day | ORAL | 3 refills | Status: DC

## 2019-11-16 MED ORDER — DENOSUMAB 60 MG/ML ~~LOC~~ SOSY
60.0000 mg | PREFILLED_SYRINGE | Freq: Once | SUBCUTANEOUS | Status: AC
  Administered 2019-11-16: 60 mg via SUBCUTANEOUS

## 2019-11-16 NOTE — Telephone Encounter (Signed)
Cologuard order faxed to [phone] with confirmation received. They will contact the patient directly.   

## 2019-11-16 NOTE — Progress Notes (Signed)
Subjective:     Kristen Morris is a 69 y.o. female and is here for a comprehensive physical exam. The patient reports problems - pt is concerned about her labs and the fact she has lost 6lbs from last year without trying.    She admits to not sleeping well. She watches her grandson and her mother is very sick in nursing home.   She has appetite and eats good.   Cologuard/flu/shingles vaccine  Social History   Socioeconomic History   Marital status: Married    Spouse name: Not on file   Number of children: Not on file   Years of education: Not on file   Highest education level: Not on file  Occupational History   Not on file  Tobacco Use   Smoking status: Never Smoker   Smokeless tobacco: Never Used  Substance and Sexual Activity   Alcohol use: No    Alcohol/week: 0.0 standard drinks   Drug use: No   Sexual activity: Yes  Other Topics Concern   Not on file  Social History Narrative   Not on file   Social Determinants of Health   Financial Resource Strain:    Difficulty of Paying Living Expenses: Not on file  Food Insecurity:    Worried About [place] in the Last Year: Not on file   Ran Out of Food in the Last Year: Not on file  Transportation Needs:    Lack of Transportation (Medical): Not on file   Lack of Transportation (Non-Medical): Not on file  Physical Activity:    Days of Exercise per Week: Not on file   Minutes of Exercise per Session: Not on file  Stress:    Feeling of Stress : Not on file  Social Connections:    Frequency of Communication with Friends and Family: Not on file   Frequency of Social Gatherings with Friends and Family: Not on file   Attends Religious Services: Not on file   Active Member of [employer] or Organizations: Not on file   Attends Archivist Meetings: Not on file   Marital Status: Not on file  Intimate Partner Violence:    Fear of Current or Ex-Partner: Not on file   Emotionally  Abused: Not on file   Physically Abused: Not on file   Sexually Abused: Not on file   Health Maintenance  Topic Date Due   Fecal DNA (Cologuard)  12/12/2018   MAMMOGRAM  07/06/2020   DEXA SCAN  09/29/2020   TETANUS/TDAP  03/08/2026   INFLUENZA VACCINE  Completed   COVID-19 Vaccine  Completed   Hepatitis C Screening  Completed   PNA vac Low Risk Adult  Completed    The following portions of the patient's history were reviewed and updated as appropriate: allergies, current medications, past family history, past medical history, past social history, past surgical history and problem list.  Review of Systems A comprehensive review of systems was negative.   Objective:    BP 117/70    Pulse 84    Ht 5' 2"  (1.575 m)    Wt 136 lb (61.7 kg)    SpO2 100%    BMI 24.87 kg/m  General appearance: alert, cooperative and appears stated age Head: Normocephalic, without obvious abnormality, atraumatic Eyes: conjunctivae/corneas clear. PERRL, EOM's intact. Fundi benign. Ears: normal TM's and external ear canals both ears Nose: Nares normal. Septum midline. Mucosa normal. No drainage or sinus tenderness. Throat: lips, mucosa, and tongue normal; teeth and gums  normal Neck: no adenopathy, no carotid bruit, no JVD, supple, symmetrical, trachea midline and thyroid not enlarged, symmetric, no tenderness/mass/nodules Back: symmetric, no curvature. ROM normal. No CVA tenderness. Lungs: clear to auscultation bilaterally Heart: regular rate and rhythm, S1, S2 normal, no murmur, click, rub or gallop Abdomen: soft, non-tender; bowel sounds normal; no masses,  no organomegaly Extremities: extremities normal, atraumatic, no cyanosis or edema Pulses: 2+ and symmetric Skin: Skin color, texture, turgor normal. No rashes or lesions Lymph nodes: Cervical, supraclavicular, and axillary nodes normal. Neurologic: Alert and oriented X 3, normal strength and tone. Normal symmetric reflexes. Normal  coordination and gait   .[name] Depression screen [facility] 2/9 11/16/2019 09/09/2018 09/02/2017 08/27/2016  Decreased Interest 0 0 0 0  Down, Depressed, Hopeless 0 0 0 0  PHQ - 2 Score 0 0 0 0  Altered sleeping - 2 2 -  Tired, decreased energy - 0 1 -  Change in appetite - 0 0 -  Feeling bad or failure about yourself  - 0 0 -  Trouble concentrating - 0 0 -  Moving slowly or fidgety/restless - 0 0 -  Suicidal thoughts - 0 0 -  PHQ-9 Score - 2 3 -  Difficult doing work/chores - Somewhat difficult Somewhat difficult -     Assessment:    Healthy female exam.      Plan:    .Marland KitchenDeanda was seen today for annual exam.  Diagnoses and all orders for this visit:  Routine physical examination  Migraine without aura and with status migrainosus, not intractable -     verapamil (CALAN) 80 MG tablet; Take 1 tablet (80 mg total) by mouth 2 (two) times daily.  Mixed hyperlipidemia -     lovastatin (MEVACOR) 20 MG tablet; Take 1 tablet (20 mg total) by mouth at bedtime.  Low serum alkaline phosphatase -     COMPLETE METABOLIC PANEL WITH GFR  Colon cancer screening -     Cologuard  Flu vaccine need -     Flu Vaccine QUAD High Dose(Fluad)  Osteoporosis, post-menopausal -     denosumab (PROLIA) injection 60 mg  Need for shingles vaccine -     AMBULATORY NON FORMULARY MEDICATION; shingrx 2 doses to prevent shingles.   .. Discussed 150 minutes of exercise a week.  Encouraged vitamin D 1000 units and Calcium 1319m or 4 servings of dairy a day.  Fasting labs ordered and reviewed in office today.  cologuard ordered.  No need for pap.  Mammogram UTD.  Bone density UTD.  Continue prolia.  Needs shingles vaccine.  Flu shot given.  UTD on covid vaccine.   Pt did have low alk phos and decreased GFR from previous blood checks. Ordered cmp to have repeat in 4 weeks. Discussed prolia could be causing the decrease in alk phos. Make sure taking vitamin D, magneisum, and zinc.   She has lost some weight  6lbs over the last year. Continue to monitor. If continues to lose weight needs more work up.   Discussed sleep hygiene. Ok to use OTC melatonin/unisom. Follow up as needed.  See After Visit Summary for Counseling Recommendations

## 2019-11-16 NOTE — Patient Instructions (Addendum)
Zinc 50mg  Magnesium 250-500mg  a day.   Health Maintenance After Age 69 After age 69, you are at a higher risk for certain long-term diseases and infections as well as injuries from falls. Falls are a major cause of broken bones and head injuries in people who are older than age 82. Getting regular preventive care can help to keep you healthy and well. Preventive care includes getting regular testing and making lifestyle changes as recommended by your health care provider. Talk with your health care provider about:  Which screenings and tests you should have. A screening is a test that checks for a disease when you have no symptoms.  A diet and exercise plan that is right for you. What should I know about screenings and tests to prevent falls? Screening and testing are the best ways to find a health problem early. Early diagnosis and treatment give you the best chance of managing medical conditions that are common after age 12. Certain conditions and lifestyle choices may make you more likely to have a fall. Your health care provider may recommend:  Regular vision checks. Poor vision and conditions such as cataracts can make you more likely to have a fall. If you wear glasses, make sure to get your prescription updated if your vision changes.  Medicine review. Work with your health care provider to regularly review all of the medicines you are taking, including over-the-counter medicines. Ask your health care provider about any side effects that may make you more likely to have a fall. Tell your health care provider if any medicines that you take make you feel dizzy or sleepy.  Osteoporosis screening. Osteoporosis is a condition that causes the bones to get weaker. This can make the bones weak and cause them to break more easily.  Blood pressure screening. Blood pressure changes and medicines to control blood pressure can make you feel dizzy.  Strength and balance checks. Your health care provider  may recommend certain tests to check your strength and balance while standing, walking, or changing positions.  Foot health exam. Foot pain and numbness, as well as not wearing proper footwear, can make you more likely to have a fall.  Depression screening. You may be more likely to have a fall if you have a fear of falling, feel emotionally low, or feel unable to do activities that you used to do.  Alcohol use screening. Using too much alcohol can affect your balance and may make you more likely to have a fall. What actions can I take to lower my risk of falls? General instructions  Talk with your health care provider about your risks for falling. Tell your health care provider if: ? You fall. Be sure to tell your health care provider about all falls, even ones that seem minor. ? You feel dizzy, sleepy, or off-balance.  Take over-the-counter and prescription medicines only as told by your health care provider. These include any supplements.  Eat a healthy diet and maintain a healthy weight. A healthy diet includes low-fat dairy products, low-fat (lean) meats, and fiber from whole grains, beans, and lots of fruits and vegetables. Home safety  Remove any tripping hazards, such as rugs, cords, and clutter.  Install safety equipment such as grab bars in bathrooms and safety rails on stairs.  Keep rooms and walkways well-lit. Activity   Follow a regular exercise program to stay fit. This will help you maintain your balance. Ask your health care provider what types of exercise are appropriate for  you.  If you need a cane or walker, use it as recommended by your health care provider.  Wear supportive shoes that have nonskid soles. Lifestyle  Do not drink alcohol if your health care provider tells you not to drink.  If you drink alcohol, limit how much you have: ? 0-1 drink a day for women. ? 0-2 drinks a day for men.  Be aware of how much alcohol is in your drink. In the [place] one  drink equals one typical bottle of beer (12 oz), one-half glass of wine (5 oz), or one shot of hard liquor (1 oz).  Do not use any products that contain nicotine or tobacco, such as cigarettes and e-cigarettes. If you need help quitting, ask your health care provider. Summary  Having a healthy lifestyle and getting preventive care can help to protect your health and wellness after age 60.  Screening and testing are the best way to find a health problem early and help you avoid having a fall. Early diagnosis and treatment give you the best chance for managing medical conditions that are more common for people who are older than age 39.  Falls are a major cause of broken bones and head injuries in people who are older than age 17. Take precautions to prevent a fall at home.  Work with your health care provider to learn what changes you can make to improve your health and wellness and to prevent falls. This information is not intended to replace advice given to you by your health care provider. Make sure you discuss any questions you have with your health care provider. Document Revised: 04/16/2018 Document Reviewed: 11/06/2016 Elsevier Patient Education  2020 [employer].

## 2019-11-17 ENCOUNTER — Encounter: Payer: Self-pay | Admitting: Physician Assistant

## 2019-11-17 DIAGNOSIS — R944 Abnormal results of kidney function studies: Secondary | ICD-10-CM | POA: Insufficient documentation

## 2019-12-15 LAB — COMPLETE METABOLIC PANEL WITH GFR
AG Ratio: 1.9 (calc) (ref 1.0–2.5)
ALT: 21 U/L (ref 6–29)
AST: 24 U/L (ref 10–35)
Albumin: 4.2 g/dL (ref 3.6–5.1)
Alkaline phosphatase (APISO): 36 U/L — ABNORMAL LOW (ref 37–153)
BUN: 22 mg/dL (ref 7–25)
CO2: 27 mmol/L (ref 20–32)
Calcium: 9.3 mg/dL (ref 8.6–10.4)
Chloride: 107 mmol/L (ref 98–110)
Creat: 0.95 mg/dL (ref 0.50–0.99)
GFR, Est African American: 71 mL/min/{1.73_m2} (ref >=60)
GFR, Est Non African American: 61 mL/min/{1.73_m2} (ref >=60)
Globulin: 2.2 g/dL (calc) (ref 1.9–3.7)
Glucose, Bld: 86 mg/dL (ref 65–139)
Potassium: 4.2 mmol/L (ref 3.5–5.3)
Sodium: 139 mmol/L (ref 135–146)
Total Bilirubin: 0.5 mg/dL (ref 0.2–1.2)
Total Protein: 6.4 g/dL (ref 6.1–8.1)

## 2019-12-20 NOTE — Progress Notes (Signed)
Ok for prolia shot.

## 2020-01-20 ENCOUNTER — Telehealth: Payer: Self-pay

## 2020-01-20 NOTE — Telephone Encounter (Signed)
Spoke with pt about completing her Cologuard pt responed that she was procrastinating about getting it done.

## 2020-02-01 ENCOUNTER — Telehealth: Payer: Self-pay | Admitting: Neurology

## 2020-02-01 NOTE — Telephone Encounter (Signed)
Patient left vm stating she had a medical emergency this weekend and wanted to speak with someone about it. [phone]. ER notes in Epic.

## 2020-02-01 NOTE — Telephone Encounter (Signed)
From ER note: Number of Diagnoses or Management Options Diarrhea, unspecified type Diagnosis management comments: Patient presented with several days of diarrhea. She also endorsed 2 syncopal episodes associated with illness. No abdominal pain to suggest severe intra-abdominal pathology. Imaging was thus deferred as far as her abdomen. She was evaluated for electrolyte abnormality, dehydration, renal failure, intracranial trauma or pathology, cardiac pathology, and COVID-19. Work-up largely normal. She will be discharged home. Unfortunately she was not able to give [place] a stool sample for further analysis.

## 2020-02-01 NOTE — Telephone Encounter (Signed)
Patient states symptoms started out of nowhere Friday afternoon.  Nausea, vomiting, diarrhea, low grade fever (highest has been 100.3) She passed out twice in 24 hours, not sure how long she was unconscious, her husband found her.  She went to urgent care on Saturday and received fluids and nausea meds Then went Sunday to ER - notes in Epic  Ongoing diarrhea and low grade fever (99).  She is concerned about what is causing this and wants Lesly Rubenstein to review notes.   I did make virtual appt to discuss with patient tomorrow.

## 2020-02-02 ENCOUNTER — Telehealth (INDEPENDENT_AMBULATORY_CARE_PROVIDER_SITE_OTHER): Payer: Medicare Other | Admitting: Physician Assistant

## 2020-02-02 ENCOUNTER — Encounter: Payer: Self-pay | Admitting: Physician Assistant

## 2020-02-02 VITALS — Ht 62.0 in | Wt 136.0 lb

## 2020-02-02 DIAGNOSIS — R112 Nausea with vomiting, unspecified: Secondary | ICD-10-CM

## 2020-02-02 DIAGNOSIS — R197 Diarrhea, unspecified: Secondary | ICD-10-CM | POA: Diagnosis not present

## 2020-02-02 DIAGNOSIS — N1832 Chronic kidney disease, stage 3b: Secondary | ICD-10-CM | POA: Diagnosis not present

## 2020-02-02 NOTE — Progress Notes (Signed)
Patient states symptoms started out of nowhere Friday afternoon.  Nausea, vomiting, diarrhea, low grade fever (highest has been 100.3) She passed out twice in 24 hours, not sure how long she was unconscious, her husband found her.  She went to urgent care on Saturday and received fluids and nausea meds Then went Sunday to ER - notes in Epic vitals great  Abnormal troponin-no chest pain Hgb12.6 3.5  Negative covid Flu RS CXR normal EKG  Weak   Eat today- yogurt   Drinking eleco gast     Ongoing diarrhea and low grade fever (99).  She is concerned about what is causing this.  From ER note: Number of Diagnoses or Management Options Diarrhea, unspecified type Diagnosis management comments: Patient presented with several days of diarrhea. She also endorsed 2 syncopal episodes associated with illness. No abdominal pain to suggest severe intra-abdominal pathology. Imaging was thus deferred as far as her abdomen. She was evaluated for electrolyte abnormality, dehydration, renal failure, intracranial trauma or pathology, cardiac pathology, and COVID-19. Work-up largely normal. She will be discharged home. Unfortunately she was not able to give [place] a stool sample for further analysis.

## 2020-02-03 LAB — BASIC METABOLIC PANEL WITH GFR
BUN: 11 mg/dL (ref 7–25)
CO2: 29 mmol/L (ref 20–32)
Calcium: 9.7 mg/dL (ref 8.6–10.4)
Chloride: 104 mmol/L (ref 98–110)
Creat: 0.92 mg/dL (ref 0.50–0.99)
GFR, Est African American: 74 mL/min/{1.73_m2} (ref >=60)
GFR, Est Non African American: 64 mL/min/{1.73_m2} (ref >=60)
Glucose, Bld: 77 mg/dL (ref 65–99)
Potassium: 3.8 mmol/L (ref 3.5–5.3)
Sodium: 141 mmol/L (ref 135–146)

## 2020-02-03 LAB — CBC WITH DIFFERENTIAL/PLATELET
Absolute Monocytes: 469 cells/uL (ref 200–950)
Basophils Absolute: 18 cells/uL (ref 0–200)
Basophils Relative: 0.4 %
Eosinophils Absolute: 51 cells/uL (ref 15–500)
Eosinophils Relative: 1.1 %
HCT: 39 % (ref 35.0–45.0)
Hemoglobin: 13.1 g/dL (ref 11.7–15.5)
Lymphs Abs: 1895 cells/uL (ref 850–3900)
MCH: 31.3 pg (ref 27.0–33.0)
MCHC: 33.6 g/dL (ref 32.0–36.0)
MCV: 93.3 fL (ref 80.0–100.0)
MPV: 9.8 fL (ref 7.5–12.5)
Monocytes Relative: 10.2 %
Neutro Abs: 2167 cells/uL (ref 1500–7800)
Neutrophils Relative %: 47.1 %
Platelets: 365 10*3/uL (ref 140–400)
RBC: 4.18 10*6/uL (ref 3.80–5.10)
RDW: 11.5 % (ref 11.0–15.0)
Total Lymphocyte: 41.2 %
WBC: 4.6 10*3/uL (ref 3.8–10.8)

## 2020-02-04 ENCOUNTER — Encounter: Payer: Self-pay | Admitting: Physician Assistant

## 2020-02-04 NOTE — Progress Notes (Signed)
Patient ID: Kristen Morris, female   DOB: 01-10-1950, 70 y.o.   MRN: [id number] .Marland KitchenVirtual Visit via Telephone Note  I connected with Kristen Morris on 02/01/2020 at 11:30 AM EST by telephone and verified that I am speaking with the correct person using two identifiers.  Location: Patient: home Provider: clinic  .Marland KitchenParticipating in visit:  Patient: Kristen Morris Provider: Tandy Gaw PA-C   I discussed the limitations, risks, security and privacy concerns of performing an evaluation and management service by telephone and the availability of in person appointments. I also discussed with the patient that there may be a patient responsible charge related to this service. The patient expressed understanding and agreed to proceed.   History of Present Illness: Patient is a 70 year old female who calls into the clinic to follow-up after ED visit for nausea, vomiting, diarrhea, fever.  Out of nowhere on Friday afternoon she started developing symptoms.  She passed out twice in 24 hours and her husband found her.  Originally they went to urgent care and given fluids and nausea medications.  Her diarrhea persisted.  They went to the ED on Sunday, 01/30/2020.  Her vitals were great.  She did have a slightly low potassium.  She did have an abnormal troponin initially that resolved while she was in the ED.  EKG was normal.  Negative for Covid, flu, RSV.  Chest x-ray normal.  Hemoglobin 12.6.  She continues to be a little weak.  She did start Imodium seems to be stopping her diarrhea and Zofran helping her nausea.  She was able to eat some yogurt today.  She is drinking Gatorade.  Patient denies any more syncope.   From ER note: Number of Diagnoses or Management Options Diarrhea, unspecified type Diagnosis management comments: Patient presented with several days of diarrhea. She also endorsed 2 syncopal episodes associated with illness. No abdominal pain to suggest severe intra-abdominal pathology. Imaging was  thus deferred as far as her abdomen. She was evaluated for electrolyte abnormality, dehydration, renal failure, intracranial trauma or pathology, cardiac pathology, and COVID-19. Work-up largely normal. She will be discharged home. Unfortunately she was not able to give [place] a stool sample for further analysis  .[name] Active Ambulatory Problems    Diagnosis Date Noted  . Depression 05/24/2015  . DDD (degenerative disc disease), lumbar 05/24/2015  . Mixed hyperlipidemia 05/24/2015  . Migraine without aura and with status migrainosus, not intractable 05/24/2015  . Post-menopausal 05/24/2015  . Family history of heart attack 05/24/2015  . Osteoporosis 05/24/2015  . Osteoporosis, 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  . CKD (chronic kidney disease) stage 3, GFR 30-59 ml/min (HCC) 03/02/2016  . Heart palpitations 09/01/2016  . Right hand pain 09/01/2016  . Moderate episode of recurrent major depressive disorder (HCC) 09/02/2017  . Acute right-sided low back pain without sciatica 09/03/2017  . Mid back pain 09/03/2017  . Left lower quadrant pain 09/03/2017  . Cyst of cervix 09/05/2017  . 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 Hidradenitis suppurativa of right axilla 06/12/2018  . De Quervain's tenosynovitis, left 07/27/2018  . Decreased GFR 11/17/2019   Resolved Ambulatory Problems    Diagnosis Date Noted  . No Resolved Ambulatory Problems   Past Medical History:  Diagnosis Date  . Hyperlipidemia       Observations/Objective: No acute distress.  Normal breathing.  Normal mood.  No cough or wheezing.   .. Today's Vitals   02/02/20 1026  Weight: 136 lb (61.7 kg)  Height: 5\' 2"  (1.575 m)   Body mass index  is 24.87 kg/m.    Assessment and Plan: .Marland KitchenVerner was seen today for diarrhea and loss of consciousness.  Diagnoses and all orders for this visit:  Diarrhea, unspecified type -     BASIC METABOLIC PANEL WITH GFR -     CBC with Differential/Platelet  Stage 3b chronic kidney  disease (HCC) -     BASIC METABOLIC PANEL WITH GFR -     CBC with Differential/Platelet  Non-intractable vomiting with nausea, unspecified vomiting type -     BASIC METABOLIC PANEL WITH GFR -     CBC with Differential/Platelet   Unclear etiology.  Likely viral gastroenteritis.  Continue with Zofran and Imodium for the next few days.  Discussed brat diet.  Will check BMP and CBC to make sure electrolytes and labs are trending in the right direction.  Continue to stay hydrated.  Follow-up with any new or concerning symptoms.   Follow Up Instructions:    I discussed the assessment and treatment plan with the patient. The patient was provided an opportunity to ask questions and all were answered. The patient agreed with the plan and demonstrated an understanding of the instructions.   The patient was advised to call back or seek an in-person evaluation if the symptoms worsen or if the condition fails to improve as anticipated.  I provided 20 minutes of non-face-to-face time during this encounter.   Tandy Gaw, PA-C

## 2020-02-04 NOTE — Progress Notes (Signed)
Still not having diarrhea or vomiting? She could just be weak from everything her body is going through. These labs are really reassuring. Any area of pain or new symptoms? Is weakness her only symptoms.

## 2020-02-04 NOTE — Progress Notes (Signed)
Kristen Morris,   Kidney function great. Electrolytes normal. No WBc elevation or anemia.

## 2020-02-04 NOTE — Progress Notes (Signed)
I know you tested negative for covid but this sounds a lot like what we are seeing from people.

## 2020-03-10 ENCOUNTER — Telehealth (INDEPENDENT_AMBULATORY_CARE_PROVIDER_SITE_OTHER): Payer: Medicare Other | Admitting: Physician Assistant

## 2020-03-10 ENCOUNTER — Encounter: Payer: Self-pay | Admitting: Physician Assistant

## 2020-03-10 VITALS — Ht 62.0 in | Wt 130.0 lb

## 2020-03-10 DIAGNOSIS — W19XXXA Unspecified fall, initial encounter: Secondary | ICD-10-CM | POA: Diagnosis not present

## 2020-03-10 DIAGNOSIS — M25552 Pain in left hip: Secondary | ICD-10-CM

## 2020-03-10 DIAGNOSIS — M810 Age-related osteoporosis without current pathological fracture: Secondary | ICD-10-CM | POA: Diagnosis not present

## 2020-03-10 MED ORDER — MELOXICAM 15 MG PO TABS: 15.0000 mg | ORAL_TABLET | Freq: Every day | ORAL | 0 refills | Status: DC

## 2020-03-10 NOTE — Progress Notes (Signed)
Fall in January during ice Slipped fell left hip/shoulder Now having pain in left side started at the end of January Pain 7/10 when walking (in the left hip) - started getting really bad in the past two days, has been progressive Having a hard time sleeping due to pain  Has been using tylenol which "takes edge off" but that's all  Has not tried OTC muscle rubs, patches, heat/ice

## 2020-03-10 NOTE — Progress Notes (Signed)
Patient ID: Kristen Morris, female   DOB: 1950-10-25, 70 y.o.   MRN: [id number] .Marland KitchenVirtual Visit via Telephone Note  I connected with Kristen Morris on 03/10/20 at  4:20 PM EST by telephone and verified that I am speaking with the correct person using two identifiers.  Location: Patient: home Provider: clinic  .Marland KitchenParticipating in visit:  Patient: Kristen Morris  Provider: Tandy Gaw PA-C   I discussed the limitations, risks, security and privacy concerns of performing an evaluation and management service by telephone and the availability of in person appointments. I also discussed with the patient that there may be a patient responsible charge related to this service. The patient expressed understanding and agreed to proceed.   History of Present Illness: Pt is a 70 yo female with osteoarthritis who calls into the clinic with left hip pain since January after a fall in the ice while taking her dog outside. She fell on her left side, hip, shoulder.  She got up and didn't think she was hurt too bad. Her pain has progressively gotten worse. She denies any radiation down leg. Rates 7/10. Worse with walking and changing positions. Started to even wake her up at night. Using tylenol and muscle rubs with little benefit.   On prolia injections.   .. Active Ambulatory Problems    Diagnosis Date Noted  . Depression 05/24/2015  . DDD (degenerative disc disease), lumbar 05/24/2015  . Mixed hyperlipidemia 05/24/2015  . Migraine without aura and with status migrainosus, not intractable 05/24/2015  . Post-menopausal 05/24/2015  . Family history of heart attack 05/24/2015  . Osteoporosis 05/24/2015  . Osteoporosis, 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  . CKD (chronic kidney disease) stage 3, GFR 30-59 ml/min (HCC) 03/02/2016  . Heart palpitations 09/01/2016  . Right hand pain 09/01/2016  . Moderate episode of recurrent major depressive disorder (HCC) 09/02/2017  . Acute right-sided low back pain without sciatica  09/03/2017  . Mid back pain 09/03/2017  . Left lower quadrant pain 09/03/2017  . Cyst of cervix 09/05/2017  . 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 Hidradenitis suppurativa of right axilla 06/12/2018  . De Quervain's tenosynovitis, left 07/27/2018  . Decreased GFR 11/17/2019   Resolved Ambulatory Problems    Diagnosis Date Noted  . No Resolved Ambulatory Problems   Past Medical History:  Diagnosis Date  . Hyperlipidemia    Reviewed med, allergy, problem list.     Observations/Objective: No acute distress  .[name] Today's Vitals   03/10/20 1551  Weight: 130 lb (59 kg)  Height: 5\' 2"  (1.575 m)   Body mass index is 23.78 kg/m.    Assessment and Plan: . Ariatna was seen today for fall.  Diagnoses and all orders for this visit:  Left hip pain -     DG HIP UNILAT WITH PELVIS 2-3 VIEWS LEFT -     meloxicam (MOBIC) 15 MG tablet; Take 1 tablet (15 mg total) by mouth daily.  Osteoporosis, post-menopausal -     DG HIP UNILAT WITH PELVIS 2-3 VIEWS LEFT  Fall, initial encounter -     DG HIP UNILAT WITH PELVIS 2-3 VIEWS LEFT   Will get xray of left hip.  Fracture vs OA vs bursitis Start mobic.  Continue tylenol as needed.  Follow up after xray.     Follow Up Instructions:    I discussed the assessment and treatment plan with the patient. The patient was provided an opportunity to ask questions and all were answered. The patient agreed with the plan and demonstrated an understanding  of the instructions.   The patient was advised to call back or seek an in-person evaluation if the symptoms worsen or if the condition fails to improve as anticipated.  I provided 20 minutes of non-face-to-face time during this encounter.   Tandy Gaw, PA-C

## 2020-03-13 ENCOUNTER — Ambulatory Visit (INDEPENDENT_AMBULATORY_CARE_PROVIDER_SITE_OTHER): Payer: Medicare Other

## 2020-03-13 ENCOUNTER — Other Ambulatory Visit: Payer: Self-pay

## 2020-03-13 DIAGNOSIS — M810 Age-related osteoporosis without current pathological fracture: Secondary | ICD-10-CM

## 2020-03-13 DIAGNOSIS — W19XXXA Unspecified fall, initial encounter: Secondary | ICD-10-CM | POA: Diagnosis not present

## 2020-03-13 DIAGNOSIS — M25552 Pain in left hip: Secondary | ICD-10-CM

## 2020-03-13 NOTE — Progress Notes (Signed)
GREAT news is no fracture or dislocation seen. Joint spaces look good. You have arthritis in your lower spine. I would suggest you coming in for a visit to see exactly where you hurt with myself or Dr. Karie Schwalbe, sports medicine provider in office.

## 2020-04-07 ENCOUNTER — Other Ambulatory Visit: Payer: Self-pay | Admitting: Physician Assistant

## 2020-04-07 DIAGNOSIS — M25552 Pain in left hip: Secondary | ICD-10-CM

## 2020-05-15 DIAGNOSIS — M25559 Pain in unspecified hip: Secondary | ICD-10-CM | POA: Diagnosis not present

## 2020-05-15 DIAGNOSIS — M25552 Pain in left hip: Secondary | ICD-10-CM | POA: Diagnosis not present

## 2020-05-31 ENCOUNTER — Other Ambulatory Visit: Payer: Self-pay | Admitting: Physician Assistant

## 2020-05-31 DIAGNOSIS — Z1231 Encounter for screening mammogram for malignant neoplasm of breast: Secondary | ICD-10-CM

## 2020-06-14 DIAGNOSIS — Z1211 Encounter for screening for malignant neoplasm of colon: Secondary | ICD-10-CM | POA: Diagnosis not present

## 2020-06-15 LAB — COLOGUARD: Cologuard: NEGATIVE

## 2020-06-19 ENCOUNTER — Telehealth: Payer: Self-pay

## 2020-06-19 ENCOUNTER — Ambulatory Visit (INDEPENDENT_AMBULATORY_CARE_PROVIDER_SITE_OTHER): Payer: Medicare Other | Admitting: Physician Assistant

## 2020-06-19 DIAGNOSIS — Z0000 Encounter for general adult medical examination without abnormal findings: Secondary | ICD-10-CM | POA: Diagnosis not present

## 2020-06-19 DIAGNOSIS — M810 Age-related osteoporosis without current pathological fracture: Secondary | ICD-10-CM

## 2020-06-19 DIAGNOSIS — Z780 Asymptomatic menopausal state: Secondary | ICD-10-CM

## 2020-06-19 LAB — COLOGUARD: COLOGUARD: NEGATIVE

## 2020-06-19 NOTE — Patient Instructions (Signed)
MEDICARE ANNUAL WELLNESS VISIT Health Maintenance Summary and Written Plan of Care  Ms. Belardo ,  Thank you for allowing me to perform your Medicare Annual Wellness Visit and for your ongoing commitment to your health.   Health Maintenance & Immunization History Health Maintenance  Topic Date Due   COVID-19 Vaccine (3 - Booster for Pfizer series) 07/05/2020 (Originally 08/31/2019)   Zoster Vaccines- Shingrix (1 of 2) 09/19/2020 (Originally 04/20/2000)   Fecal DNA (Cologuard)  06/19/2021 (Originally 12/12/2018)   MAMMOGRAM  07/06/2020   INFLUENZA VACCINE  08/07/2020   DEXA SCAN  09/29/2020   TETANUS/TDAP  03/08/2026   Hepatitis C Screening  Completed   PNA vac Low Risk Adult  Completed   HPV VACCINES  Aged Out   Immunization History  Administered Date(s) Administered   Fluad Quad(high Dose 65+) 10/14/2018, 11/16/2019   Influenza,inj,Quad PF,6+ Mos 09/02/2017   Influenza-Unspecified 09/02/2017, 10/14/2018, 11/16/2019   PFIZER(Purple Top)SARS-COV-2 Vaccination 03/05/2019, 03/31/2019   Pneumococcal Conjugate-13 08/28/2015   Pneumococcal Polysaccharide-23 09/02/2017   Tdap 03/07/2016    These are the patient goals that we discussed:  Goals Addressed               This Visit's Progress     Patient Stated (pt-stated)        06/19/2020 AWV Goal: Exercise for General Health  Patient will verbalize understanding of the benefits of increased physical activity: Exercising regularly is important. It will improve your overall fitness, flexibility, and endurance. Regular exercise also will improve your overall health. It can help you control your weight, reduce stress, and improve your bone density. Over the next year, patient will increase physical activity as tolerated with a goal of at least 150 minutes of moderate physical activity per week.  You can tell that you are exercising at a moderate intensity if your heart starts beating faster and you start breathing faster but can  still hold a conversation. Moderate-intensity exercise ideas include: Walking 1 mile (1.6 km) in about 15 minutes Biking Hiking Golfing Dancing Water aerobics Patient will verbalize understanding of everyday activities that increase physical activity by providing examples like the following: Yard work, such as: Insurance underwriter Gardening Washing windows or floors Patient will be able to explain general safety guidelines for exercising:  Before you start a new exercise program, talk with your health care provider. Do not exercise so much that you hurt yourself, feel dizzy, or get very short of breath. Wear comfortable clothes and wear shoes with good support. Drink plenty of water while you exercise to prevent dehydration or heat stroke. Work out until your breathing and your heartbeat get faster.             This is a list of Health Maintenance Items that are overdue or due now: Screening mammography Bone densitometry screening Colorectal cancer screening  Mammogram is scheduled for July, 2022.  Cologuard sample has been sent in; pending results.  Bone density will be due in September, 2022.   Orders/Referrals Placed Today: Orders Placed This Encounter  Procedures   DEXAScan    Standing Status:   Future    Standing Expiration Date:   06/19/2021    Scheduling Instructions:     Please call patient to schedule. She will be due after 09/29/20.    Order Specific Question:   Reason for exam:    Answer:   Post menopausal    Order Specific  Question:   Preferred imaging location?    Answer:   [facility]   (Contact our referral department at [phone] if you have not spoken with someone about your referral appointment within the next 5 days)    Follow-up Plan Follow-up with Jomarie Longs, PA-C as planned Schedule your shingrix vaccine at your pharmacy.  Bone Density  referral has been sent.  Medicare wellness in one year.

## 2020-06-19 NOTE — Telephone Encounter (Signed)
Kristen Morris needs a Prolia injection. We will have to do a prior authorization and she will need to have labs drawn.

## 2020-06-19 NOTE — Telephone Encounter (Signed)
No PA is required. I called patient to schedule for the Prolia injection. She states she will call back to schedule. She will need to schedule after blood draw.

## 2020-06-19 NOTE — Progress Notes (Signed)
MEDICARE ANNUAL WELLNESS VISIT  06/19/2020  Telephone Visit Disclaimer This Medicare AWV was conducted by telephone due to national recommendations for restrictions regarding the COVID-19 Pandemic (e.g. social distancing).  I verified, using two identifiers, that I am speaking with Kristen Morris Youkhana or their authorized healthcare agent. I discussed the limitations, risks, security, and privacy concerns of performing an evaluation and management service by telephone and the potential availability of an in-person appointment in the future. The patient expressed understanding and agreed to proceed.  Location of Patient: Home Location of Provider (nurse):  In the office.  Subjective:    Kristen Morris Pestka is a 70 y.o. female patient of Caleen EssexBreeback, Lonna CobbJade L, PA-C who had a [employer] Annual Wellness Visit today via telephone. [name] is Retired and lives with their spouse. she has 2 children. she reports that she is socially active and does interact with friends/family regularly. she is moderately physically active and enjoys sewing, mowing the lawn and reading.  Patient Care Team: Nolene EbbsBreeback, Jade L, PA-C as PCP - General (Family Medicine)  Advanced Directives 06/19/2020 08/20/2019 09/10/2017 05/24/2015  Does Patient Have a Medical Advance Directive? No No No No  Would patient like information on creating a medical advance directive? No - Patient declined No - Patient declined No - Patient declined No - patient declined information    Hospital Utilization Over the Past 12 Months: # of hospitalizations or ER visits: 1 # of surgeries: 0  Review of Systems    Patient reports that her overall health is unchanged compared to last year.  History obtained from chart review and the patient  Patient Reported Readings (BP, Pulse, CBG, Weight, etc) none  Pain Assessment Pain : No/denies pain     Current Medications & Allergies (verified) Allergies as of 06/19/2020   No Known Allergies      Medication  List        Accurate as of June 19, 2020  9:45 AM. If you have any questions, ask your nurse or doctor.          Calcium 500 MG tablet Take 600 mg by mouth.   clobetasol cream 0.05 % Commonly known as: TEMOVATE Apply 1 application topically 2 (two) times daily.   denosumab 60 MG/ML Soln injection Commonly known as: PROLIA Inject 60 mg into the skin every 6 (six) months. Administer in upper arm, thigh, or abdomen   lovastatin 20 MG tablet Commonly known as: MEVACOR Take 1 tablet (20 mg total) by mouth at bedtime.   Magnesium 100 MG Caps Take by mouth.   meloxicam 15 MG tablet Commonly known as: MOBIC TAKE 1 TABLET (15 MG TOTAL) BY MOUTH DAILY.   ondansetron 4 MG disintegrating tablet Commonly known as: ZOFRAN-ODT Take 4 mg by mouth every 6 (six) hours as needed.   verapamil 80 MG tablet Commonly known as: CALAN Take 1 tablet (80 mg total) by mouth 2 (two) times daily.   ZINC 15 PO Take by mouth.        History (reviewed): Past Medical History:  Diagnosis Date   Hyperlipidemia    Past Surgical History:  Procedure Laterality Date   CESAREAN SECTION     Family History  Problem Relation Age of Onset   Heart attack Father    Cancer Brother    Social History   Socioeconomic History   Marital status: Married    Spouse name: Ian MalkinJ.C   Number of children: 2   Years of education: 13   Highest education level:  Associate degree: occupational, technical, or vocational program  Occupational History    Comment: Retired  Tobacco Use   Smoking status: Never   Smokeless tobacco: Never  Substance and Sexual Activity   Alcohol use: No    Alcohol/week: 0.0 standard drinks   Drug use: No   Sexual activity: Yes  Other Topics Concern   Not on file  Social History Narrative   Lives with her husband. Likes to sew, read and mow the lawn.   Social Determinants of Health   Financial Resource Strain: Low Risk    Difficulty of Paying Living Expenses: Not hard at all   Food Insecurity: No Food Insecurity   Worried About Programme researcher, broadcasting/film/video in the Last Year: Never true   Ran Out of Food in the Last Year: Never true  Transportation Needs: No Transportation Needs   Lack of Transportation (Medical): No   Lack of Transportation (Non-Medical): No  Physical Activity: Sufficiently Active   Days of Exercise per Week: 3 days   Minutes of Exercise per Session: 50 min  Stress: Stress Concern Present   Feeling of Stress : To some extent  Social Connections: Moderately Integrated   Frequency of Communication with Friends and Family: More than three times a week   Frequency of Social Gatherings with Friends and Family: Twice a week   Attends Religious Services: More than 4 times per year   Active Member of [employer] or Organizations: No   Attends Banker Meetings: Never   Marital Status: Married    Activities of Daily Living In your present state of health, do you have any difficulty performing the following activities: 06/19/2020  Hearing? N  Vision? N  Difficulty concentrating or making decisions? N  Walking or climbing stairs? N  Dressing or bathing? N  Doing errands, shopping? N  Preparing Food and eating ? N  Using the Toilet? N  In the past six months, have you accidently leaked urine? N  Do you have problems with loss of bowel control? N  Managing your Medications? N  Managing your Finances? N  Housekeeping or managing your Housekeeping? N  Some recent data might be hidden    Patient Education/ Literacy How often do you need to have someone help you when you read instructions, pamphlets, or other written materials from your doctor or pharmacy?: 1 - Never What is the last grade level you completed in school?: Associates degree (LPN)  Exercise Current Exercise Habits: Home exercise routine, Type of exercise: Other - see comments, Time (Minutes): 45, Frequency (Times/Week): 3, Weekly Exercise (Minutes/Week): 135, Intensity: Moderate,  Exercise limited by: None identified  Diet Patient reports consuming 2 meals a day and 1 snack(s) a day Patient reports that her primary diet is: Regular Patient reports that she does have regular access to food.   Depression Screen PHQ 2/9 Scores 06/19/2020 11/16/2019 09/09/2018 09/02/2017 08/27/2016  PHQ - 2 Score 0 0 0 0 0  PHQ- 9 Score - - 2 3 -     Fall Risk Fall Risk  06/19/2020 11/16/2019 09/01/2016  Falls in the past year? 1 0 No  Number falls in past yr: 0 0 -  Injury with Fall? 0 0 -  Risk for fall due to : No Fall Risks - -  Follow up Falls evaluation completed;Education provided;Falls prevention discussed Falls evaluation completed -     Objective:  Jelene Albano seemed alert and oriented and she participated appropriately during our telephone visit.  Blood  Pressure Weight BMI  BP Readings from Last 3 Encounters:  11/16/19 117/70  08/12/19 110/80  04/28/19 (!) 121/91   Wt Readings from Last 3 Encounters:  03/10/20 130 lb (59 kg)  02/02/20 136 lb (61.7 kg)  11/16/19 136 lb (61.7 kg)   BMI Readings from Last 1 Encounters:  03/10/20 23.78 kg/m    *Unable to obtain current vital signs, weight, and BMI due to telephone visit type  Hearing/Vision  Harryette did not seem to have difficulty with hearing/understanding during the telephone conversation Reports that she has had a formal eye exam by an eye care professional within the past year Reports that she has not had a formal hearing evaluation within the past year *Unable to fully assess hearing and vision during telephone visit type  Cognitive Function: 6CIT Screen 06/19/2020 09/01/2016  What Year? 0 points 0 points  What month? 0 points 0 points  What time? 0 points 0 points  Count back from 20 0 points 0 points  Months in reverse 0 points 0 points  Repeat phrase 0 points 2 points  Total Score 0 2   (Normal:0-7, Significant for Dysfunction: >8)  Normal Cognitive Function Screening: Yes   Immunization & Health  Maintenance Record Immunization History  Administered Date(s) Administered   Fluad Quad(high Dose 65+) 10/14/2018, 11/16/2019   Influenza,inj,Quad PF,6+ Mos 09/02/2017   Influenza-Unspecified 09/02/2017, 10/14/2018, 11/16/2019   PFIZER(Purple Top)SARS-COV-2 Vaccination 03/05/2019, 03/31/2019   Pneumococcal Conjugate-13 08/28/2015   Pneumococcal Polysaccharide-23 09/02/2017   Tdap 03/07/2016    Health Maintenance  Topic Date Due   COVID-19 Vaccine (3 - Booster for Pfizer series) 07/05/2020 (Originally 08/31/2019)   Zoster Vaccines- Shingrix (1 of 2) 09/19/2020 (Originally 04/20/2000)   Fecal DNA (Cologuard)  06/19/2021 (Originally 12/12/2018)   MAMMOGRAM  07/06/2020   INFLUENZA VACCINE  08/07/2020   DEXA SCAN  09/29/2020   TETANUS/TDAP  03/08/2026   Hepatitis C Screening  Completed   PNA vac Low Risk Adult  Completed   HPV VACCINES  Aged Out       Assessment  This is a routine wellness examination for [employer].  Health Maintenance: Due or Overdue There are no preventive care reminders to display for this patient.   Kristen Razor does not need a referral for Community Assistance: Care Management:   no Social Work:    no Prescription Assistance:  no Nutrition/Diabetes Education:  no   Plan:  Personalized Goals  Goals Addressed               This Visit's Progress     Patient Stated (pt-stated)        06/19/2020 AWV Goal: Exercise for General Health  Patient will verbalize understanding of the benefits of increased physical activity: Exercising regularly is important. It will improve your overall fitness, flexibility, and endurance. Regular exercise also will improve your overall health. It can help you control your weight, reduce stress, and improve your bone density. Over the next year, patient will increase physical activity as tolerated with a goal of at least 150 minutes of moderate physical activity per week.  You can tell that you are exercising at a  moderate intensity if your heart starts beating faster and you start breathing faster but can still hold a conversation. Moderate-intensity exercise ideas include: Walking 1 mile (1.6 km) in about 15 minutes Biking Hiking Golfing Dancing Water aerobics Patient will verbalize understanding of everyday activities that increase physical activity by providing examples like the following: Yard work, such as:  Pushing a Tax inspector your car Pushing a stroller Shoveling snow Gardening Washing windows or floors Patient will be able to explain general safety guidelines for exercising:  Before you start a new exercise program, talk with your health care provider. Do not exercise so much that you hurt yourself, feel dizzy, or get very short of breath. Wear comfortable clothes and wear shoes with good support. Drink plenty of water while you exercise to prevent dehydration or heat stroke. Work out until your breathing and your heartbeat get faster.           Personalized Health Maintenance & Screening Recommendations  Screening mammography Bone densitometry screening Colorectal cancer screening  Mammogram is scheduled for July, 2022.  Cologuard sample has been sent in; pending results.  Bone density will be due in September, 2022.   Lung Cancer Screening Recommended: no (Low Dose CT Chest recommended if Age 75-80 years, 30 pack-year currently smoking OR have quit w/in past 15 years) Hepatitis C Screening recommended: no HIV Screening recommended: no  Advanced Directives: Written information was not prepared per patient's request.  Referrals & Orders Orders Placed This Encounter  Procedures   DEXAScan    Follow-up Plan Follow-up with Jomarie Longs, PA-C as planned Schedule your shingrix vaccine at your pharmacy.  Bone Density referral has been sent.  Medicare wellness in one year. AVS printed and mailed.   I have personally reviewed and  noted the following in the patient's chart:   Medical and social history Use of alcohol, tobacco or illicit drugs  Current medications and supplements Functional ability and status Nutritional status Physical activity Advanced directives List of other physicians Hospitalizations, surgeries, and ER visits in previous 12 months Vitals Screenings to include cognitive, depression, and falls Referrals and appointments  In addition, I have reviewed and discussed with Kristen Razor certain preventive protocols, quality metrics, and best practice recommendations. A written personalized care plan for preventive services as well as general preventive health recommendations is available and can be mailed to the patient at her request.      Modesto Charon, RN  06/19/2020

## 2020-06-22 ENCOUNTER — Telehealth: Payer: Self-pay | Admitting: Neurology

## 2020-06-22 DIAGNOSIS — M810 Age-related osteoporosis without current pathological fracture: Secondary | ICD-10-CM | POA: Diagnosis not present

## 2020-06-22 LAB — COMPLETE METABOLIC PANEL WITH GFR
AG Ratio: 1.8 (calc) (ref 1.0–2.5)
ALT: 20 U/L (ref 6–29)
AST: 22 U/L (ref 10–35)
Albumin: 4 g/dL (ref 3.6–5.1)
Alkaline phosphatase (APISO): 34 U/L — ABNORMAL LOW (ref 37–153)
BUN: 19 mg/dL (ref 7–25)
CO2: 26 mmol/L (ref 20–32)
Calcium: 9.7 mg/dL (ref 8.6–10.4)
Chloride: 109 mmol/L (ref 98–110)
Creat: 0.93 mg/dL (ref 0.60–0.93)
GFR, Est African American: 72 mL/min/{1.73_m2} (ref >=60)
GFR, Est Non African American: 62 mL/min/{1.73_m2} (ref >=60)
Globulin: 2.2 g/dL (calc) (ref 1.9–3.7)
Glucose, Bld: 91 mg/dL (ref 65–139)
Potassium: 4.2 mmol/L (ref 3.5–5.3)
Sodium: 142 mmol/L (ref 135–146)
Total Bilirubin: 0.3 mg/dL (ref 0.2–1.2)
Total Protein: 6.2 g/dL (ref 6.1–8.1)

## 2020-06-22 NOTE — Telephone Encounter (Signed)
Cologuard negative. Abstracted. Tried to call patient and it states, "patient not accepting messages right now"

## 2020-06-23 NOTE — Telephone Encounter (Signed)
Patient made aware.

## 2020-06-23 NOTE — Progress Notes (Signed)
Cmp stable. Ok for prolia.

## 2020-06-27 ENCOUNTER — Other Ambulatory Visit: Payer: Self-pay

## 2020-06-27 ENCOUNTER — Ambulatory Visit (INDEPENDENT_AMBULATORY_CARE_PROVIDER_SITE_OTHER): Payer: Medicare Other | Admitting: Physician Assistant

## 2020-06-27 VITALS — BP 104/73 | HR 99 | Resp 20 | Ht 62.0 in | Wt 136.0 lb

## 2020-06-27 DIAGNOSIS — M818 Other osteoporosis without current pathological fracture: Secondary | ICD-10-CM

## 2020-06-27 MED ORDER — DENOSUMAB 60 MG/ML ~~LOC~~ SOSY
60.0000 mg | PREFILLED_SYRINGE | Freq: Once | SUBCUTANEOUS | Status: AC
  Administered 2020-06-27: 60 mg via SUBCUTANEOUS

## 2020-06-27 NOTE — Progress Notes (Signed)
Patient ID: Kristen Morris, female   DOB: 11-22-1950, 70 y.o.   MRN: [id number] Agree with above plan.

## 2020-06-27 NOTE — Progress Notes (Signed)
Established Patient Office Visit  Subjective:  Patient ID: Kristen Morris, female    DOB: 10/12/1950  Age: 70 y.o. MRN: [id number]  CC:  Chief Complaint  Patient presents with   Osteoporosis    HPI Kristen Morris presents for Patient presents today as a nurse visit for Prolia 60 mg/1 ml injection. Patient is scheduled to get this injection every 6 months. Patient denies falls, CP, palpitations, ShOB, dizziness/lightheadedness, abdominal pain, headache, and mood swings.   Injection given in left arm. Pt tolerated injection well without complications. Pt instructed to schedule a nurse visit for the next injection that will be due in 6 months with lab work to be done 1 week prior.   Past Medical History:  Diagnosis Date   Hyperlipidemia     Past Surgical History:  Procedure Laterality Date   CESAREAN SECTION      Family History  Problem Relation Age of Onset   Heart attack Father    Cancer Brother     Social History   Socioeconomic History   Marital status: Married    Spouse name: J.C   Number of children: 2   Years of education: 13   Highest education level: Associate degree: occupational, Scientist, product/process development, or vocational program  Occupational History    Comment: Retired  Tobacco Use   Smoking status: Never   Smokeless tobacco: Never  Substance and Sexual Activity   Alcohol use: No    Alcohol/week: 0.0 standard drinks   Drug use: No   Sexual activity: Yes  Other Topics Concern   Not on file  Social History Narrative   Lives with her husband. Likes to sew, read and mow the lawn.   Social Determinants of Health   Financial Resource Strain: Low Risk    Difficulty of Paying Living Expenses: Not hard at all  Food Insecurity: No Food Insecurity   Worried About Programme researcher, broadcasting/film/video in the Last Year: Never true   Ran Out of Food in the Last Year: Never true  Transportation Needs: No Transportation Needs   Lack of Transportation (Medical): No   Lack of Transportation  (Non-Medical): No  Physical Activity: Sufficiently Active   Days of Exercise per Week: 3 days   Minutes of Exercise per Session: 50 min  Stress: Stress Concern Present   Feeling of Stress : To some extent  Social Connections: Moderately Integrated   Frequency of Communication with Friends and Family: More than three times a week   Frequency of Social Gatherings with Friends and Family: Twice a week   Attends Religious Services: More than 4 times per year   Active Member of [employer] or Organizations: No   Attends Engineer, structural: Never   Marital Status: Married  Catering manager Violence: Not At Risk   Fear of Current or Ex-Partner: No   Emotionally Abused: No   Physically Abused: No   Sexually Abused: No    Outpatient Medications Prior to Visit  Medication Sig Dispense Refill   Calcium 500 MG tablet Take 600 mg by mouth.     clobetasol cream (TEMOVATE) 0.05 % Apply 1 application topically 2 (two) times daily. (Patient not taking: Reported on 06/19/2020) 60 g 2   denosumab (PROLIA) 60 MG/ML SOLN injection Inject 60 mg into the skin every 6 (six) months. Administer in upper arm, thigh, or abdomen     lovastatin (MEVACOR) 20 MG tablet Take 1 tablet (20 mg total) by mouth at bedtime. 90 tablet 3   Magnesium  100 MG CAPS Take by mouth.     meloxicam (MOBIC) 15 MG tablet TAKE 1 TABLET (15 MG TOTAL) BY MOUTH DAILY. (Patient not taking: Reported on 06/19/2020) 30 tablet 0   ondansetron (ZOFRAN-ODT) 4 MG disintegrating tablet Take 4 mg by mouth every 6 (six) hours as needed. (Patient not taking: Reported on 06/19/2020)     verapamil (CALAN) 80 MG tablet Take 1 tablet (80 mg total) by mouth 2 (two) times daily. 180 tablet 3   Zinc Sulfate (ZINC 15 PO) Take by mouth.     No facility-administered medications prior to visit.    No Known Allergies  ROS Review of Systems    Objective:    Physical Exam  BP 104/73 (BP Location: Left Arm, Patient Position: Sitting, Cuff Size:  Normal)   Pulse 99   Resp 20   Ht 5\' 2"  (1.575 m)   Wt 136 lb (61.7 kg)   SpO2 98%   BMI 24.87 kg/m  Wt Readings from Last 3 Encounters:  06/27/20 136 lb (61.7 kg)  03/10/20 130 lb (59 kg)  02/02/20 136 lb (61.7 kg)     There are no preventive care reminders to display for this patient.  There are no preventive care reminders to display for this patient.  Lab Results  Component Value Date   TSH 2.56 10/29/2019   Lab Results  Component Value Date   WBC 4.6 02/03/2020   HGB 13.1 02/03/2020   HCT 39.0 02/03/2020   MCV 93.3 02/03/2020   PLT 365 02/03/2020   Lab Results  Component Value Date   NA 142 06/22/2020   K 4.2 06/22/2020   CO2 26 06/22/2020   GLUCOSE 91 06/22/2020   BUN 19 06/22/2020   CREATININE 0.93 06/22/2020   BILITOT 0.3 06/22/2020   ALKPHOS 35 08/27/2016   AST 22 06/22/2020   ALT 20 06/22/2020   PROT 6.2 06/22/2020   ALBUMIN 3.9 08/27/2016   CALCIUM 9.7 06/22/2020   Lab Results  Component Value Date   CHOL 182 10/29/2019   Lab Results  Component Value Date   HDL 58 10/29/2019   Lab Results  Component Value Date   LDLCALC 104 (H) 10/29/2019   Lab Results  Component Value Date   TRIG 105 10/29/2019   Lab Results  Component Value Date   CHOLHDL 3.1 10/29/2019   No results found for: HGBA1C    Assessment & Plan:  Injection given in left arm. Pt tolerated injection well without complications. Pt instructed to schedule a nurse visit for the next injection that will be due in 6 months with lab work to be done 1 week prior.  Problem List Items Addressed This Visit       Musculoskeletal and Integument   Osteoporosis - Primary    Meds ordered this encounter  Medications   denosumab (PROLIA) injection 60 mg    Order Specific Question:   Patient is enrolled in [facility] program for this medication and I have provided a copy of the Prolia Medication Guide and Patient Brochure.    Answer:   Yes    Order Specific Question:   I have reviewed  with the patient the information in the Prolia Medication Guide and Patient Counseling Chart including the serious risks of Prolia and symptoms of each risk.    Answer:   Yes    Order Specific Question:   I have advised the patient to seek medical attention if they have signs or symptoms of any of  the serious risks.    Answer:   Yes    Follow-up: Return in about 6 months (around 12/27/2020) for Prolia Injection.    Kathrynn Speed, CMA

## 2020-07-12 ENCOUNTER — Ambulatory Visit: Payer: Medicare Other

## 2020-07-22 DIAGNOSIS — Z20822 Contact with and (suspected) exposure to covid-19: Secondary | ICD-10-CM | POA: Diagnosis not present

## 2020-08-21 ENCOUNTER — Other Ambulatory Visit (HOSPITAL_BASED_OUTPATIENT_CLINIC_OR_DEPARTMENT_OTHER): Payer: Self-pay | Admitting: Radiology

## 2020-08-24 ENCOUNTER — Ambulatory Visit (INDEPENDENT_AMBULATORY_CARE_PROVIDER_SITE_OTHER): Payer: Medicare Other

## 2020-08-24 ENCOUNTER — Other Ambulatory Visit: Payer: Self-pay

## 2020-08-24 DIAGNOSIS — Z1231 Encounter for screening mammogram for malignant neoplasm of breast: Secondary | ICD-10-CM | POA: Diagnosis not present

## 2020-08-25 NOTE — Progress Notes (Signed)
Normal mammogram. Follow up in 1 year.

## 2020-10-11 ENCOUNTER — Other Ambulatory Visit: Payer: Self-pay

## 2020-10-11 ENCOUNTER — Ambulatory Visit (INDEPENDENT_AMBULATORY_CARE_PROVIDER_SITE_OTHER): Payer: Medicare Other

## 2020-10-11 DIAGNOSIS — Z780 Asymptomatic menopausal state: Secondary | ICD-10-CM

## 2020-10-11 DIAGNOSIS — M810 Age-related osteoporosis without current pathological fracture: Secondary | ICD-10-CM | POA: Diagnosis not present

## 2020-10-11 DIAGNOSIS — Z0000 Encounter for general adult medical examination without abnormal findings: Secondary | ICD-10-CM

## 2020-10-12 ENCOUNTER — Encounter: Payer: Self-pay | Admitting: Physician Assistant

## 2020-10-12 DIAGNOSIS — M8580 Other specified disorders of bone density and structure, unspecified site: Secondary | ICD-10-CM | POA: Insufficient documentation

## 2020-10-12 NOTE — Progress Notes (Signed)
Bone density shows osteopenia, low bone mass. Continue vitamin D and calcium with low weight bearing exercise regularly. Recheck in 2 years.

## 2020-10-24 ENCOUNTER — Telehealth (INDEPENDENT_AMBULATORY_CARE_PROVIDER_SITE_OTHER): Payer: Medicare Other | Admitting: Family Medicine

## 2020-10-24 ENCOUNTER — Encounter: Payer: Self-pay | Admitting: Family Medicine

## 2020-10-24 VITALS — BP 119/82 | Temp 99.0°F | Wt 135.0 lb

## 2020-10-24 DIAGNOSIS — J40 Bronchitis, not specified as acute or chronic: Secondary | ICD-10-CM

## 2020-10-24 DIAGNOSIS — J329 Chronic sinusitis, unspecified: Secondary | ICD-10-CM

## 2020-10-24 MED ORDER — AZITHROMYCIN 250 MG PO TABS: ORAL_TABLET | ORAL | 0 refills | Status: AC

## 2020-10-24 NOTE — Progress Notes (Signed)
Cant taste/smell x 5 days.   Highest temp 99.4 out of the past 4 days.  Sneezing/coughing/nasal congestion (yellow), no facial pain/pressure,she did report experiencing some teeth pain that has since gone away. She has only taken claritin and tylenol she stopped the claritin last week since it didn't help any.   Negative home covid test last week.   No sick contacts.

## 2020-10-24 NOTE — Progress Notes (Addendum)
Virtual Visit via Telephone Note  I connected with Kristen Morris on 10/30/20 at  2:40 PM EDT by telephone and verified that I am speaking with the correct person using two identifiers.   I discussed the limitations, risks, security and privacy concerns of performing an evaluation and management service by telephone and the availability of in person appointments. I also discussed with the patient that there may be a patient responsible charge related to this service. The patient expressed understanding and agreed to proceed.  Patient location: at home Provider loccation: In office   Subjective:    CC: sinus sxs  HPI: Sick x 5 days. Can't taste/smell x 5 days. Highest temp 99.4 out of the past 4 days.   Sneezing/coughing/nasal congestion (yellow), no facial pain/pressure,she did report experiencing some teeth pain that has since gone away. She has only taken claritin and tylenol she stopped the claritin last week since it didn't help any.  Lightheaded when cough and blow nose. Started vomiting last night. No LN swollen.  No diarrhea.    Negative home covid test last week. No sick contacts.    Past medical history, Surgical history, Family history not pertinant except as noted below, Social history, Allergies, and medications have been entered into the medical record, reviewed, and corrections made.   Review of Systems: No fevers, chills, night sweats, weight loss, chest pain, or shortness of breath.   Objective:    General: Speaking clearly in complete sentences without any shortness of breath.  Alert and oriented x3.  Normal judgment. No apparent acute distress.    Impression and Recommendations:    Sinobronchitis - since getting worse and still running low grade temp will start Zpack. Did encourage her to give another 24 hours before starting if she can. Recommend symptomatic care. Recommend test for COVID one more time.      I discussed the assessment and treatment plan with  the patient. The patient was provided an opportunity to ask questions and all were answered. The patient agreed with the plan and demonstrated an understanding of the instructions.   The patient was advised to call back or seek an in-person evaluation if the symptoms worsen or if the condition fails to improve as anticipated.  I provided 15 minutes of non-face-to-face time during this encounter.   Nani Gasser, MD

## 2020-11-01 ENCOUNTER — Telehealth: Payer: Self-pay | Admitting: Neurology

## 2020-11-01 DIAGNOSIS — Z131 Encounter for screening for diabetes mellitus: Secondary | ICD-10-CM

## 2020-11-01 DIAGNOSIS — E782 Mixed hyperlipidemia: Secondary | ICD-10-CM

## 2020-11-01 DIAGNOSIS — Z1329 Encounter for screening for other suspected endocrine disorder: Secondary | ICD-10-CM

## 2020-11-01 DIAGNOSIS — Z0000 Encounter for general adult medical examination without abnormal findings: Secondary | ICD-10-CM

## 2020-11-01 NOTE — Telephone Encounter (Signed)
Signed.

## 2020-11-01 NOTE — Telephone Encounter (Signed)
Patient called to get labs done prior to her physical. Labs pended. Please review and sign if appropriate.

## 2020-11-01 NOTE — Telephone Encounter (Signed)
Patient made aware labs ordered.  

## 2020-11-08 DIAGNOSIS — Z131 Encounter for screening for diabetes mellitus: Secondary | ICD-10-CM | POA: Diagnosis not present

## 2020-11-08 DIAGNOSIS — Z1329 Encounter for screening for other suspected endocrine disorder: Secondary | ICD-10-CM | POA: Diagnosis not present

## 2020-11-08 DIAGNOSIS — Z0000 Encounter for general adult medical examination without abnormal findings: Secondary | ICD-10-CM | POA: Diagnosis not present

## 2020-11-08 DIAGNOSIS — E782 Mixed hyperlipidemia: Secondary | ICD-10-CM | POA: Diagnosis not present

## 2020-11-08 LAB — CBC WITH DIFFERENTIAL/PLATELET
Absolute Monocytes: 428 cells/uL (ref 200–950)
Basophils Absolute: 28 cells/uL (ref 0–200)
Basophils Relative: 0.6 %
Eosinophils Absolute: 110 cells/uL (ref 15–500)
Eosinophils Relative: 2.4 %
HCT: 38.8 % (ref 35.0–45.0)
Hemoglobin: 12.8 g/dL (ref 11.7–15.5)
Lymphs Abs: 2222 cells/uL (ref 850–3900)
MCH: 31.1 pg (ref 27.0–33.0)
MCHC: 33 g/dL (ref 32.0–36.0)
MCV: 94.2 fL (ref 80.0–100.0)
MPV: 9.7 fL (ref 7.5–12.5)
Monocytes Relative: 9.3 %
Neutro Abs: 1812 cells/uL (ref 1500–7800)
Neutrophils Relative %: 39.4 %
Platelets: 361 10*3/uL (ref 140–400)
RBC: 4.12 10*6/uL (ref 3.80–5.10)
RDW: 11.5 % (ref 11.0–15.0)
Total Lymphocyte: 48.3 %
WBC: 4.6 10*3/uL (ref 3.8–10.8)

## 2020-11-08 LAB — LIPID PANEL W/REFLEX DIRECT LDL
Cholesterol: 190 mg/dL (ref <200)
HDL: 51 mg/dL (ref >=50)
LDL Cholesterol (Calc): 113 mg/dL (calc) — ABNORMAL HIGH
Non-HDL Cholesterol (Calc): 139 mg/dL (calc) — ABNORMAL HIGH (ref <130)
Total CHOL/HDL Ratio: 3.7 (calc) (ref <5.0)
Triglycerides: 151 mg/dL — ABNORMAL HIGH (ref <150)

## 2020-11-08 LAB — COMPLETE METABOLIC PANEL WITH GFR
AG Ratio: 1.9 (calc) (ref 1.0–2.5)
ALT: 17 U/L (ref 6–29)
AST: 20 U/L (ref 10–35)
Albumin: 4.1 g/dL (ref 3.6–5.1)
Alkaline phosphatase (APISO): 30 U/L — ABNORMAL LOW (ref 37–153)
BUN/Creatinine Ratio: 24 (calc) — ABNORMAL HIGH (ref 6–22)
BUN: 26 mg/dL — ABNORMAL HIGH (ref 7–25)
CO2: 26 mmol/L (ref 20–32)
Calcium: 9.3 mg/dL (ref 8.6–10.4)
Chloride: 110 mmol/L (ref 98–110)
Creat: 1.1 mg/dL — ABNORMAL HIGH (ref 0.60–1.00)
Globulin: 2.2 g/dL (calc) (ref 1.9–3.7)
Glucose, Bld: 82 mg/dL (ref 65–99)
Potassium: 4.3 mmol/L (ref 3.5–5.3)
Sodium: 144 mmol/L (ref 135–146)
Total Bilirubin: 0.5 mg/dL (ref 0.2–1.2)
Total Protein: 6.3 g/dL (ref 6.1–8.1)
eGFR: 54 mL/min/{1.73_m2} — ABNORMAL LOW (ref >=60)

## 2020-11-08 LAB — TSH: TSH: 2.33 mIU/L (ref 0.40–4.50)

## 2020-11-09 NOTE — Progress Notes (Signed)
Kristen Morris,   Thyroid looks good.  Kidney function dropped some from 4 months ago. Are you taking more ibuprofen or OTC medication?  Make sure you are staying hydrated.  Recheck in 2 months.  HDL, good cholesterol, is great.  LDL, bad cholesterol is elevated some.  Due to age your 10 year CV risk is 8.3 percent. Over 7.5 percent we recommend starting a statin to help lower your overall CV risk. Thoughts?

## 2020-11-10 NOTE — Progress Notes (Signed)
Ok make sure to stay hydrated and lets recheck in 1 month and see if trending down.   Ok I did not see it initially on medication list.

## 2020-11-16 ENCOUNTER — Telehealth: Payer: Self-pay | Admitting: Physician Assistant

## 2020-11-16 ENCOUNTER — Other Ambulatory Visit: Payer: Self-pay

## 2020-11-16 ENCOUNTER — Encounter: Payer: Self-pay | Admitting: Physician Assistant

## 2020-11-16 ENCOUNTER — Ambulatory Visit (INDEPENDENT_AMBULATORY_CARE_PROVIDER_SITE_OTHER): Payer: Medicare Other | Admitting: Physician Assistant

## 2020-11-16 VITALS — BP 119/75 | HR 79 | Temp 98.0°F | Ht 62.0 in | Wt 133.0 lb

## 2020-11-16 DIAGNOSIS — Z23 Encounter for immunization: Secondary | ICD-10-CM | POA: Diagnosis not present

## 2020-11-16 DIAGNOSIS — F439 Reaction to severe stress, unspecified: Secondary | ICD-10-CM

## 2020-11-16 DIAGNOSIS — G8929 Other chronic pain: Secondary | ICD-10-CM

## 2020-11-16 DIAGNOSIS — M810 Age-related osteoporosis without current pathological fracture: Secondary | ICD-10-CM | POA: Diagnosis not present

## 2020-11-16 DIAGNOSIS — G43001 Migraine without aura, not intractable, with status migrainosus: Secondary | ICD-10-CM | POA: Diagnosis not present

## 2020-11-16 DIAGNOSIS — Z0000 Encounter for general adult medical examination without abnormal findings: Secondary | ICD-10-CM | POA: Diagnosis not present

## 2020-11-16 DIAGNOSIS — N1832 Chronic kidney disease, stage 3b: Secondary | ICD-10-CM | POA: Diagnosis not present

## 2020-11-16 DIAGNOSIS — M25551 Pain in right hip: Secondary | ICD-10-CM

## 2020-11-16 DIAGNOSIS — E782 Mixed hyperlipidemia: Secondary | ICD-10-CM | POA: Diagnosis not present

## 2020-11-16 MED ORDER — LOVASTATIN 20 MG PO TABS: 20.0000 mg | ORAL_TABLET | Freq: Every day | ORAL | 3 refills | Status: DC

## 2020-11-16 MED ORDER — VERAPAMIL HCL 80 MG PO TABS: 80.0000 mg | ORAL_TABLET | Freq: Two times a day (BID) | ORAL | 3 refills | Status: DC

## 2020-11-16 NOTE — Progress Notes (Signed)
Subjective:     Kristen Morris is a 70 y.o. female and is here for a comprehensive physical exam. The patient reports problems - SHE has had a lot of stress this year. Her nephew killed himself. Her mother passed away and left her estate in a mess. Her husbands partner died in a car accident. She feels very overwhelmed and stressed. She used to be on celexa but felt it made her too numb.   Intermittent right hip pain from OA after the fall. Mobic helps as needed.   Social History   Socioeconomic History   Marital status: Married    Spouse name: Kristen Morris   Number of children: 2   Years of education: 13   Highest education level: Associate degree: occupational, Hotel manager, or vocational program  Occupational History    Comment: Retired  Tobacco Use   Smoking status: Never   Smokeless tobacco: Never  Substance and Sexual Activity   Alcohol use: No    Alcohol/week: 0.0 standard drinks   Drug use: No   Sexual activity: Yes  Other Topics Concern   Not on file  Social History Narrative   Lives with her husband. Likes to sew, read and mow the lawn.   Social Determinants of Health   Financial Resource Strain: Low Risk    Difficulty of Paying Living Expenses: Not hard at all  Food Insecurity: No Food Insecurity   Worried About Charity fundraiser in the Last Year: Never true   [place] in the Last Year: Never true  Transportation Needs: No Transportation Needs   Lack of Transportation (Medical): No   Lack of Transportation (Non-Medical): No  Physical Activity: Sufficiently Active   Days of Exercise per Week: 3 days   Minutes of Exercise per Session: 50 min  Stress: Stress Concern Present   Feeling of Stress : To some extent  Social Connections: Moderately Integrated   Frequency of Communication with Friends and Family: More than three times a week   Frequency of Social Gatherings with Friends and Family: Twice a week   Attends Religious Services: More than 4 times per year    Active Member of [employer] or Organizations: No   Attends Archivist Meetings: Never   Marital Status: Married  Human resources officer Violence: Not At Risk   Fear of Current or Ex-Partner: No   Emotionally Abused: No   Physically Abused: No   Sexually Abused: No   Health Maintenance  Topic Date Due   Zoster Vaccines- Shingrix (1 of 2) 01/24/2021 (Originally 04/20/2000)   COVID-19 Vaccine (3 - Booster for [place] series) 11/09/2021 (Originally 05/26/2019)   MAMMOGRAM  08/24/2021   DEXA SCAN  10/12/2022   Fecal DNA (Cologuard)  06/16/2023   TETANUS/TDAP  03/08/2026   Pneumonia Vaccine 8+ Years old  Completed   INFLUENZA VACCINE  Completed   Hepatitis C Screening  Completed   HPV VACCINES  Aged Out    The following portions of the patient's history were reviewed and updated as appropriate: allergies, current medications, past family history, past medical history, past social history, past surgical history, and problem list.  Review of Systems A comprehensive review of systems was negative.   Objective:    BP 119/75   Pulse 79   Temp 98 F (36.7 C)   Ht 5\' 2"  (1.575 m)   Wt 133 lb (60.3 kg)   SpO2 100%   BMI 24.33 kg/m  General appearance: alert, cooperative, and appears stated age  Head: Normocephalic, without obvious abnormality, atraumatic Eyes: conjunctivae/corneas clear. PERRL, EOM's intact. Fundi benign. Ears: normal TM's and external ear canals both ears Nose: Nares normal. Septum midline. Mucosa normal. No drainage or sinus tenderness. Throat: lips, mucosa, and tongue normal; teeth and gums normal Neck: no adenopathy, no carotid bruit, no JVD, supple, symmetrical, trachea midline, and thyroid not enlarged, symmetric, no tenderness/mass/nodules Back: symmetric, no curvature. ROM normal. No CVA tenderness. Lungs: clear to auscultation bilaterally Heart: regular rate and rhythm, S1, S2 normal, no murmur, click, rub or gallop Abdomen: soft, non-tender; bowel sounds  normal; no masses,  no organomegaly Extremities: extremities normal, atraumatic, no cyanosis or edema Pulses: 2+ and symmetric Skin: Skin color, texture, turgor normal. No rashes or lesions Lymph nodes: Cervical, supraclavicular, and axillary nodes normal. Neurologic: Grossly normal   .. Depression screen [facility] 2/9 06/19/2020 11/16/2019 09/09/2018 09/02/2017 08/27/2016  Decreased Interest 0 0 0 0 0  Down, Depressed, Hopeless 0 0 0 0 0  PHQ - 2 Score 0 0 0 0 0  Altered sleeping - - 2 2 -  Tired, decreased energy - - 0 1 -  Change in appetite - - 0 0 -  Feeling bad or failure about yourself  - - 0 0 -  Trouble concentrating - - 0 0 -  Moving slowly or fidgety/restless - - 0 0 -  Suicidal thoughts - - 0 0 -  PHQ-9 Score - - 2 3 -  Difficult doing work/chores - - Somewhat difficult Somewhat difficult -    Assessment:    Healthy female exam.     Plan:     .Marland KitchenCandus was seen today for annual exam.  Diagnoses and all orders for this visit:  Routine physical examination -     BASIC METABOLIC PANEL WITH GFR  Need for influenza vaccination -     Flu Vaccine QUAD High Dose(Fluad)  Migraine without aura and with status migrainosus, not intractable -     verapamil (CALAN) 80 MG tablet; Take 1 tablet (80 mg total) by mouth 2 (two) times daily.  Mixed hyperlipidemia -     lovastatin (MEVACOR) 20 MG tablet; Take 1 tablet (20 mg total) by mouth at bedtime.  Osteoporosis, post-menopausal  Stage 3b chronic kidney disease (HCC) -     BASIC METABOLIC PANEL WITH GFR  Stress  Chronic right hip pain   .[name] Discussed 150 minutes of exercise a week.  Encouraged vitamin D 1000 units and Calcium 1300mg  or 4 servings of dairy a day.  PHQ and GAD up some.  Discussed lavendar for sleep and ashwaganda for stress.  Fasting labs reviewed.  LDL 113. On statin. Reviewed heart healthy diet.  GFR down some. Recheck BMP today.  Cologuard UTD.  Mammogram UTD.  Bone density UTD. Osteoporosis on vitamin D  and calcium. Prolia every 6 months.  Encouraged covid booster.  Encouraged shingrx at pharmacy.  Pneumonia vaccine UTD.  Flu shot given today.   Discussed turmeric for OA pain in hip. Only use mobic sparingly as needed.     See After Visit Summary for Counseling Recommendations

## 2020-11-16 NOTE — Telephone Encounter (Signed)
PA for Prolia is not required.   She is not due for the Prolia injection until 12/27/20.  Per Lesly Rubenstein: ok to schedule for 12/21. ok for kidney function to be slightly out of window

## 2020-11-16 NOTE — Telephone Encounter (Signed)
Pt has a scheduled appt on 12/27/20 for prolia injection for nurse. AM

## 2020-11-16 NOTE — Telephone Encounter (Signed)
Pt is due in December for her Prolia injection. She would like to schedule this.

## 2020-11-16 NOTE — Patient Instructions (Addendum)
Turmeric 500mg  twice a day.  Lavender at night oil Ashwaganda pill 600mg  a day divided into 2 doses.   Health Maintenance After Age 70 After age 109, you are at a higher risk for certain long-term diseases and infections as well as injuries from falls. Falls are a major cause of broken bones and head injuries in people who are older than age 70. Getting regular preventive care can help to keep you healthy and well. Preventive care includes getting regular testing and making lifestyle changes as recommended by your health care provider. Talk with your health care provider about: Which screenings and tests you should have. A screening is a test that checks for a disease when you have no symptoms. A diet and exercise plan that is right for you. What should I know about screenings and tests to prevent falls? Screening and testing are the best ways to find a health problem early. Early diagnosis and treatment give you the best chance of managing medical conditions that are common after age 70. Certain conditions and lifestyle choices may make you more likely to have a fall. Your health care provider may recommend: Regular vision checks. Poor vision and conditions such as cataracts can make you more likely to have a fall. If you wear glasses, make sure to get your prescription updated if your vision changes. Medicine review. Work with your health care provider to regularly review all of the medicines you are taking, including over-the-counter medicines. Ask your health care provider about any side effects that may make you more likely to have a fall. Tell your health care provider if any medicines that you take make you feel dizzy or sleepy. Strength and balance checks. Your health care provider may recommend certain tests to check your strength and balance while standing, walking, or changing positions. Foot health exam. Foot pain and numbness, as well as not wearing proper footwear, can make you more likely  to have a fall. Screenings, including: Osteoporosis screening. Osteoporosis is a condition that causes the bones to get weaker and break more easily. Blood pressure screening. Blood pressure changes and medicines to control blood pressure can make you feel dizzy. Depression screening. You may be more likely to have a fall if you have a fear of falling, feel depressed, or feel unable to do activities that you used to do. Alcohol use screening. Using too much alcohol can affect your balance and may make you more likely to have a fall. Follow these instructions at home: Lifestyle Do not drink alcohol if: Your health care provider tells you not to drink. If you drink alcohol: Limit how much you have to: 0-1 drink a day for women. 0-2 drinks a day for men. Know how much alcohol is in your drink. In the [place] one drink equals one 12 oz bottle of beer (355 mL), one 5 oz glass of wine (148 mL), or one 1 oz glass of hard liquor (44 mL). Do not use any products that contain nicotine or tobacco. These products include cigarettes, chewing tobacco, and vaping devices, such as e-cigarettes. If you need help quitting, ask your health care provider. Activity  Follow a regular exercise program to stay fit. This will help you maintain your balance. Ask your health care provider what types of exercise are appropriate for you. If you need a cane or walker, use it as recommended by your health care provider. Wear supportive shoes that have nonskid soles. Safety  Remove any tripping hazards, such as rugs,  cords, and clutter. Install safety equipment such as grab bars in bathrooms and safety rails on stairs. Keep rooms and walkways well-lit. General instructions Talk with your health care provider about your risks for falling. Tell your health care provider if: You fall. Be sure to tell your health care provider about all falls, even ones that seem minor. You feel dizzy, tiredness (fatigue), or  off-balance. Take over-the-counter and prescription medicines only as told by your health care provider. These include supplements. Eat a healthy diet and maintain a healthy weight. A healthy diet includes low-fat dairy products, low-fat (lean) meats, and fiber from whole grains, beans, and lots of fruits and vegetables. Stay current with your vaccines. Schedule regular health, dental, and eye exams. Summary Having a healthy lifestyle and getting preventive care can help to protect your health and wellness after age 70. Screening and testing are the best way to find a health problem early and help you avoid having a fall. Early diagnosis and treatment give you the best chance for managing medical conditions that are more common for people who are older than age 58. Falls are a major cause of broken bones and head injuries in people who are older than age 70. Take precautions to prevent a fall at home. Work with your health care provider to learn what changes you can make to improve your health and wellness and to prevent falls. This information is not intended to replace advice given to you by your health care provider. Make sure you discuss any questions you have with your health care provider. Document Revised: 05/15/2020 Document Reviewed: 05/15/2020 Elsevier Patient Education  2022 Elsevier Inc. Influenza (Flu) Vaccine (Inactivated or Recombinant): What You Need to Know 1. Why get vaccinated? Influenza vaccine can prevent influenza (flu). Flu is a contagious disease that spreads around the [place] every year, usually between October and May. Anyone can get the flu, but it is more dangerous for some people. Infants and young children, people 70 years and older, pregnant people, and people with certain health conditions or a weakened immune system are at greatest risk of flu complications. Pneumonia, bronchitis, sinus infections, and ear infections are examples of flu-related complications.  If you have a medical condition, such as heart disease, cancer, or diabetes, flu can make it worse. Flu can cause fever and chills, sore throat, muscle aches, fatigue, cough, headache, and runny or stuffy nose. Some people may have vomiting and diarrhea, though this is more common in children than adults. In an average year, thousands of people in the [place] die from flu, and many more are hospitalized. Flu vaccine prevents millions of illnesses and flu-related visits to the doctor each year. 2. Influenza vaccines CDC recommends everyone 6 months and older get vaccinated every flu season. Children 6 months through 27 years of age may need 2 doses during a single flu season. Everyone else needs only 1 dose each flu season. It takes about 2 weeks for protection to develop after vaccination. There are many flu viruses, and they are always changing. Each year a new flu vaccine is made to protect against the influenza viruses believed to be likely to cause disease in the upcoming flu season. Even when the vaccine doesn't exactly match these viruses, it may still provide some protection. Influenza vaccine does not cause flu. Influenza vaccine may be given at the same time as other vaccines. 3. Talk with your health care provider Tell your vaccination provider if the person getting the vaccine:  Has had an allergic reaction after a previous dose of influenza vaccine, or has any severe, life-threatening allergies Has ever had Guillain-Barr Syndrome (also called "GBS") In some cases, your health care provider may decide to postpone influenza vaccination until a future visit. Influenza vaccine can be administered at any time during pregnancy. People who are or will be pregnant during influenza season should receive inactivated influenza vaccine. People with minor illnesses, such as a cold, may be vaccinated. People who are moderately or severely ill should usually wait until they recover before getting  influenza vaccine. Your health care provider can give you more information. 4. Risks of a vaccine reaction Soreness, redness, and swelling where the shot is given, fever, muscle aches, and headache can happen after influenza vaccination. There may be a very small increased risk of Guillain-Barr Syndrome (GBS) after inactivated influenza vaccine (the flu shot). Young children who get the flu shot along with pneumococcal vaccine (PCV13) and/or DTaP vaccine at the same time might be slightly more likely to have a seizure caused by fever. Tell your health care provider if a child who is getting flu vaccine has ever had a seizure. People sometimes faint after medical procedures, including vaccination. Tell your provider if you feel dizzy or have vision changes or ringing in the ears. As with any medicine, there is a very remote chance of a vaccine causing a severe allergic reaction, other serious injury, or death. 5. What if there is a serious problem? An allergic reaction could occur after the vaccinated person leaves the clinic. If you see signs of a severe allergic reaction (hives, swelling of the face and throat, difficulty breathing, a fast heartbeat, dizziness, or weakness), call 9-1-1 and get the person to the nearest hospital. For other signs that concern you, call your health care provider. Adverse reactions should be reported to the Vaccine Adverse Event Reporting System (VAERS). Your health care provider will usually file this report, or you can do it yourself. Visit the VAERS website at www.vaers.LAgents.no or call (857) 526-0111. VAERS is only for reporting reactions, and VAERS staff members do not give medical advice. 6. The National Vaccine Injury Compensation Program The [employer] Vaccine Injury Compensation Program (VICP) is a federal program that was created to compensate people who may have been injured by certain vaccines. Claims regarding alleged injury or death due to vaccination have a  time limit for filing, which may be as short as two years. Visit the VICP website at SpiritualWord.at or call 769-266-8213 to learn about the program and about filing a claim. 7. How can I learn more? Ask your health care provider. Call your local or state health department. Visit the website of the Food and Drug Administration (FDA) for vaccine package inserts and additional information at FinderList.no. Contact the Centers for Disease Control and Prevention (CDC): Call 8576032547 (1-800-CDC-INFO) or Visit CDC's website at BiotechRoom.com.cy. Vaccine Information Statement Inactivated Influenza Vaccine (08/13/2019) This information is not intended to replace advice given to you by your health care provider. Make sure you discuss any questions you have with your health care provider. Document Revised: 09/14/2020 Document Reviewed: 09/14/2020 Elsevier Patient Education  2022 [employer].

## 2020-11-17 ENCOUNTER — Encounter: Payer: Medicare Other | Admitting: Physician Assistant

## 2020-11-17 ENCOUNTER — Other Ambulatory Visit: Payer: Self-pay | Admitting: Neurology

## 2020-11-17 DIAGNOSIS — M25552 Pain in left hip: Secondary | ICD-10-CM

## 2020-11-17 LAB — BASIC METABOLIC PANEL WITH GFR
BUN: 19 mg/dL (ref 7–25)
CO2: 26 mmol/L (ref 20–32)
Calcium: 10 mg/dL (ref 8.6–10.4)
Chloride: 106 mmol/L (ref 98–110)
Creat: 0.87 mg/dL (ref 0.60–1.00)
Glucose, Bld: 83 mg/dL (ref 65–99)
Potassium: 4.7 mmol/L (ref 3.5–5.3)
Sodium: 142 mmol/L (ref 135–146)
eGFR: 72 mL/min/{1.73_m2} (ref >=60)

## 2020-11-17 MED ORDER — MELOXICAM 15 MG PO TABS: 15.0000 mg | ORAL_TABLET | Freq: Every day | ORAL | 0 refills | Status: DC

## 2020-11-17 MED ORDER — AMBULATORY NON FORMULARY MEDICATION: 0 refills | Status: DC

## 2020-11-17 NOTE — Telephone Encounter (Signed)
Ok to take. I will refill for as needed. Taking daily could begin to effect your kidney function again. Your GFR today is not in kidney disease range so we will take that off your problem list.

## 2020-11-17 NOTE — Telephone Encounter (Signed)
I called patient to let her know kidney function is better.   She had a lot of questions about labs and wanted to discuss other things, feels like appt yesterday was rushed. She was only scheduled for a physical. Virtual appt made for Monday to discuss issues.   She wanted to know if she could get Meloxicam for arthritis pain. She was last prescribed in April from [place], but orthopedic had sent since then. She wanted Espn Zeman to send this in. Not on current med list. Please advise.   Also wanted RX for shingles RX at pharmacy.

## 2020-11-17 NOTE — Progress Notes (Signed)
Kidney function MUCH better. Ok for prolia shot as well next month.

## 2020-11-20 ENCOUNTER — Telehealth (INDEPENDENT_AMBULATORY_CARE_PROVIDER_SITE_OTHER): Payer: Medicare Other | Admitting: Physician Assistant

## 2020-11-20 VITALS — Ht 62.0 in | Wt 133.0 lb

## 2020-11-20 DIAGNOSIS — M25552 Pain in left hip: Secondary | ICD-10-CM | POA: Diagnosis not present

## 2020-11-20 DIAGNOSIS — R944 Abnormal results of kidney function studies: Secondary | ICD-10-CM | POA: Diagnosis not present

## 2020-11-20 DIAGNOSIS — Z23 Encounter for immunization: Secondary | ICD-10-CM | POA: Diagnosis not present

## 2020-11-20 DIAGNOSIS — G8929 Other chronic pain: Secondary | ICD-10-CM | POA: Diagnosis not present

## 2020-11-20 MED ORDER — AMBULATORY NON FORMULARY MEDICATION: 0 refills | Status: DC

## 2020-11-20 NOTE — Progress Notes (Signed)
..  Virtual Visit via Telephone Note  I connected with Kristen Morris on 11/20/20 at 10:50 AM EST by telephone and verified that I am speaking with the correct person using two identifiers.  Location: Patient: home Provider: clinic  .Marland KitchenParticipating in visit:  Patient: Kristen Morris Provider: Tandy Gaw PA-C   I discussed the limitations, risks, security and privacy concerns of performing an evaluation and management service by telephone and the availability of in person appointments. I also discussed with the patient that there may be a patient responsible charge related to this service. The patient expressed understanding and agreed to proceed.   History of Present Illness: Pt is a 70 yo female who calls into the clinic with problem list questions and concerns.   She is concerned about the CKD 3 listed.   She wonders if she should continue taking cinnamon and apple cider vinegar for cholesterol.   Turmeric is helping with overall pain a lot.   Started ashwaganda but no improvement in anxiety yet.    .. 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   Heart palpitations 09/01/2016   Moderate episode of recurrent major depressive disorder (HCC) 09/02/2017   Acute right-sided low back pain without sciatica 09/03/2017   Mid back pain 09/03/2017   Left lower quadrant pain 09/03/2017   Cyst of cervix 09/05/2017   Thoracic degenerative disc disease 09/05/2017   Osteopenia 10/12/2020   Stress 11/16/2020   Chronic right hip pain 11/16/2020   Chronic left hip pain 11/20/2020   Need for shingles vaccine 11/20/2020   Resolved Ambulatory Problems     Diagnosis Date Noted   CKD (chronic kidney disease) stage 3, GFR 30-59 ml/min (HCC) 03/02/2016   Right hand pain 09/01/2016   Hidradenitis suppurativa of right axilla 06/12/2018   De Quervain's tenosynovitis, left 07/27/2018   Decreased GFR 11/17/2019   Past Medical History:  Diagnosis Date   Hyperlipidemia        Observations/Objective: No acute distress Normal breathing   Assessment and Plan: .Marland KitchenZaelyn was seen today for follow-up.  Diagnoses and all orders for this visit:  Decreased GFR  Chronic left hip pain  Need for shingles vaccine -     AMBULATORY NON FORMULARY MEDICATION; Shingles vaccine  [place] [place] [facility]   Encouraged by turmeric helping.   Discussed GFR increased to 74 and took CKD off problem list.  Use mobic only as needed. Recheck in 6 months.   Ok to stop cinnamon and apple cider vinegar. Continue statin.   Updated problem list.    Follow Up Instructions:    I discussed the assessment and treatment plan with the patient. The patient was provided an opportunity to ask questions and all were answered. The patient agreed with the plan and demonstrated an understanding of the instructions.   The patient was advised to call back or seek an in-person evaluation if the symptoms worsen or if the condition fails to improve as anticipated.  I provided 20 minutes of non-face-to-face time during this encounter.   Tandy Gaw, PA-C

## 2020-12-20 ENCOUNTER — Telehealth: Payer: Self-pay | Admitting: *Deleted

## 2020-12-20 DIAGNOSIS — M810 Age-related osteoporosis without current pathological fracture: Secondary | ICD-10-CM

## 2020-12-20 NOTE — Telephone Encounter (Signed)
BMP ordered for Prolia injection

## 2020-12-21 DIAGNOSIS — M810 Age-related osteoporosis without current pathological fracture: Secondary | ICD-10-CM | POA: Diagnosis not present

## 2020-12-22 LAB — BASIC METABOLIC PANEL
BUN: 20 mg/dL (ref 7–25)
CO2: 24 mmol/L (ref 20–32)
Calcium: 9.4 mg/dL (ref 8.6–10.4)
Chloride: 107 mmol/L (ref 98–110)
Creat: 0.89 mg/dL (ref 0.60–1.00)
Glucose, Bld: 89 mg/dL (ref 65–139)
Potassium: 4 mmol/L (ref 3.5–5.3)
Sodium: 140 mmol/L (ref 135–146)

## 2020-12-22 NOTE — Progress Notes (Signed)
Ok for prolia

## 2020-12-27 ENCOUNTER — Other Ambulatory Visit: Payer: Self-pay

## 2020-12-27 ENCOUNTER — Ambulatory Visit (INDEPENDENT_AMBULATORY_CARE_PROVIDER_SITE_OTHER): Payer: Medicare Other | Admitting: Physician Assistant

## 2020-12-27 VITALS — BP 128/85 | HR 78

## 2020-12-27 DIAGNOSIS — M810 Age-related osteoporosis without current pathological fracture: Secondary | ICD-10-CM

## 2020-12-27 MED ORDER — DENOSUMAB 60 MG/ML ~~LOC~~ SOSY
60.0000 mg | PREFILLED_SYRINGE | Freq: Once | SUBCUTANEOUS | Status: AC
  Administered 2020-12-27: 11:00:00 60 mg via SUBCUTANEOUS

## 2020-12-27 NOTE — Progress Notes (Signed)
Established Patient Office Visit  Subjective:  Patient ID: Kristen Morris, female    DOB: 02/11/1950  Age: 70 y.o. MRN: [id number]  CC:  Chief Complaint  Patient presents with   Osteoporosis    HPI Anjana Cheek presents for Prolia injection.   Past Medical History:  Diagnosis Date   Hyperlipidemia     Past Surgical History:  Procedure Laterality Date   CESAREAN SECTION      Family History  Problem Relation Age of Onset   Heart attack Father    Cancer Brother     Social History   Socioeconomic History   Marital status: Married    Spouse name: J.C   Number of children: 2   Years of education: 13   Highest education level: Associate degree: occupational, Hotel manager, or vocational program  Occupational History    Comment: Retired  Tobacco Use   Smoking status: Never   Smokeless tobacco: Never  Substance and Sexual Activity   Alcohol use: No    Alcohol/week: 0.0 standard drinks   Drug use: No   Sexual activity: Yes  Other Topics Concern   Not on file  Social History Narrative   Lives with her husband. Likes to sew, read and mow the lawn.   Social Determinants of Health   Financial Resource Strain: Low Risk    Difficulty of Paying Living Expenses: Not hard at all  Food Insecurity: No Food Insecurity   Worried About Charity fundraiser in the Last Year: Never true   [place] in the Last Year: Never true  Transportation Needs: No Transportation Needs   Lack of Transportation (Medical): No   Lack of Transportation (Non-Medical): No  Physical Activity: Sufficiently Active   Days of Exercise per Week: 3 days   Minutes of Exercise per Session: 50 min  Stress: Stress Concern Present   Feeling of Stress : To some extent  Social Connections: Moderately Integrated   Frequency of Communication with Friends and Family: More than three times a week   Frequency of Social Gatherings with Friends and Family: Twice a week   Attends Religious Services:  More than 4 times per year   Active Member of [employer] or Organizations: No   Attends Music therapist: Never   Marital Status: Married  Human resources officer Violence: Not At Risk   Fear of Current or Ex-Partner: No   Emotionally Abused: No   Physically Abused: No   Sexually Abused: No    Outpatient Medications Prior to Visit  Medication Sig Dispense Refill   AMBULATORY NON FORMULARY MEDICATION Shingles vaccine 2 Syringe 0   APPLE CIDER VINEGAR PO Take by mouth daily.     Calcium 500 MG tablet Take 600 mg by mouth.     CINNAMON PO Take by mouth daily.     clobetasol cream (TEMOVATE) 9.47 % Apply 1 application topically 2 (two) times daily. 60 g 2   denosumab (PROLIA) 60 MG/ML SOLN injection Inject 60 mg into the skin every 6 (six) months. Administer in upper arm, thigh, or abdomen     lovastatin (MEVACOR) 20 MG tablet Take 1 tablet (20 mg total) by mouth at bedtime. 90 tablet 3   Magnesium 100 MG CAPS Take by mouth.     meloxicam (MOBIC) 15 MG tablet Take 1 tablet (15 mg total) by mouth daily. 30 tablet 0   Multiple Vitamin tablet Take 1 tablet by mouth daily.     verapamil (CALAN) 80 MG tablet  Take 1 tablet (80 mg total) by mouth 2 (two) times daily. 180 tablet 3   Zinc Sulfate (ZINC 15 PO) Take by mouth.     No facility-administered medications prior to visit.    No Known Allergies  ROS Review of Systems    Objective:    Physical Exam  BP 128/85    Pulse 78    SpO2 100%  Wt Readings from Last 3 Encounters:  11/20/20 133 lb (60.3 kg)  11/16/20 133 lb (60.3 kg)  10/24/20 135 lb (61.2 kg)     There are no preventive care reminders to display for this patient.  There are no preventive care reminders to display for this patient.  Lab Results  Component Value Date   TSH 2.33 11/08/2020   Lab Results  Component Value Date   WBC 4.6 11/08/2020   HGB 12.8 11/08/2020   HCT 38.8 11/08/2020   MCV 94.2 11/08/2020   PLT 361 11/08/2020   Lab Results  Component  Value Date   NA 140 12/21/2020   K 4.0 12/21/2020   CO2 24 12/21/2020   GLUCOSE 89 12/21/2020   BUN 20 12/21/2020   CREATININE 0.89 12/21/2020   BILITOT 0.5 11/08/2020   ALKPHOS 35 08/27/2016   AST 20 11/08/2020   ALT 17 11/08/2020   PROT 6.3 11/08/2020   ALBUMIN 3.9 08/27/2016   CALCIUM 9.4 12/21/2020   EGFR 72 11/16/2020   Lab Results  Component Value Date   CHOL 190 11/08/2020   Lab Results  Component Value Date   HDL 51 11/08/2020   Lab Results  Component Value Date   LDLCALC 113 (H) 11/08/2020   Lab Results  Component Value Date   TRIG 151 (H) 11/08/2020   Lab Results  Component Value Date   CHOLHDL 3.7 11/08/2020   No results found for: HGBA1C    Assessment & Plan:  Osteoporosis - Patient tolerated injection well without complications. Patient advised to schedule next injection 6 months from today.     Problem List Items Addressed This Visit     Osteoporosis, post-menopausal - Primary    Meds ordered this encounter  Medications   denosumab (PROLIA) injection 60 mg    Order Specific Question:   Patient is enrolled in [facility] program for this medication and I have provided a copy of the Prolia Medication Guide and Patient Brochure.    Answer:   No    Order Specific Question:   I have reviewed with the patient the information in the Prolia Medication Guide and Patient Counseling Chart including the serious risks of Prolia and symptoms of each risk.    Answer:   Yes    Order Specific Question:   I have advised the patient to seek medical attention if they have signs or symptoms of any of the serious risks.    Answer:   Yes    Follow-up: Return in about 6 months (around 06/27/2021) for Prolia injection. Durene Romans, Monico Blitz, [place]

## 2020-12-27 NOTE — Progress Notes (Signed)
Agree with above plan. 

## 2021-02-19 ENCOUNTER — Telehealth: Payer: Medicare Other | Admitting: Family Medicine

## 2021-02-19 ENCOUNTER — Telehealth: Payer: Self-pay | Admitting: Neurology

## 2021-02-19 NOTE — Telephone Encounter (Signed)
Yeah well we need to see the rash or at least talk to her? Could she send a picture to a phone if she cannot use mychart?

## 2021-02-19 NOTE — Telephone Encounter (Signed)
Kristen Morris - not sure what to do with this.

## 2021-02-19 NOTE — Telephone Encounter (Signed)
Patient called and left vm that she believes she has shingles, can we call and get her scheduled for an appt for evaluation?

## 2021-02-19 NOTE — Telephone Encounter (Signed)
Kristen Morris - Patient thinks she has shingles, but is unwilling to be seen for an appt. FYI.

## 2021-02-19 NOTE — Telephone Encounter (Signed)
Attempted to call and schedule patient for a visit. Patient stated she was going out of town and could not come in office. I offered a virtual visit, but noticed patient is not active on MyChart. I asked patient to sign up for MyChart so we could schedule a virtual visit & patient stated she does not know how, she is not tech savvy, and that she will just "deal with this" since she is going out of town and has no way to schedule a virtual visit.

## 2021-02-20 ENCOUNTER — Telehealth: Payer: Medicare Other | Admitting: Physician Assistant

## 2021-02-20 NOTE — Telephone Encounter (Signed)
The only other solution may be for the patient to send a picture directly to one of the front desk staff as an e-mail attachment. Kristen Morris is willing to have the patient send her a picture to her work e-mail ([email or web address]).

## 2021-02-20 NOTE — Telephone Encounter (Signed)
Can we offer telephone appt if patient can send photo to email?

## 2021-02-20 NOTE — Telephone Encounter (Signed)
Patient agreed to a phone visited & stated she would send a picture via Email to [place]. Patient was driving and could not take down Patrice's email at the moment. I scheduled patient for a telephone call at 4:20pm with [name] and patient stated she would call back to get Patrice's email to send the picture directly to her. I let patient know that we would need the picture via Email before the visit this afternoon. Patient stated she would do her best but that she is traveling right now.

## 2021-02-21 DIAGNOSIS — B029 Zoster without complications: Secondary | ICD-10-CM | POA: Diagnosis not present

## 2021-06-01 DIAGNOSIS — M25552 Pain in left hip: Secondary | ICD-10-CM | POA: Diagnosis not present

## 2021-06-01 DIAGNOSIS — M25562 Pain in left knee: Secondary | ICD-10-CM | POA: Diagnosis not present

## 2021-06-13 DIAGNOSIS — M545 Low back pain, unspecified: Secondary | ICD-10-CM | POA: Diagnosis not present

## 2021-06-13 DIAGNOSIS — M549 Dorsalgia, unspecified: Secondary | ICD-10-CM | POA: Diagnosis not present

## 2021-07-09 ENCOUNTER — Encounter: Payer: Self-pay | Admitting: Family Medicine

## 2021-07-09 ENCOUNTER — Telehealth: Payer: Self-pay | Admitting: Physician Assistant

## 2021-07-09 ENCOUNTER — Ambulatory Visit (INDEPENDENT_AMBULATORY_CARE_PROVIDER_SITE_OTHER): Payer: Medicare Other

## 2021-07-09 ENCOUNTER — Ambulatory Visit (INDEPENDENT_AMBULATORY_CARE_PROVIDER_SITE_OTHER): Payer: Medicare Other | Admitting: Family Medicine

## 2021-07-09 VITALS — BP 119/78 | HR 85 | Resp 16 | Ht 62.0 in | Wt 140.0 lb

## 2021-07-09 DIAGNOSIS — R55 Syncope and collapse: Secondary | ICD-10-CM

## 2021-07-09 DIAGNOSIS — M25472 Effusion, left ankle: Secondary | ICD-10-CM

## 2021-07-09 DIAGNOSIS — R002 Palpitations: Secondary | ICD-10-CM | POA: Diagnosis not present

## 2021-07-09 DIAGNOSIS — M25572 Pain in left ankle and joints of left foot: Secondary | ICD-10-CM

## 2021-07-09 DIAGNOSIS — M810 Age-related osteoporosis without current pathological fracture: Secondary | ICD-10-CM

## 2021-07-09 NOTE — Telephone Encounter (Signed)
Patient would like to get scheduled for her next prolia shot. Last one was in December of last year.

## 2021-07-09 NOTE — Progress Notes (Signed)
Acute Office Visit  Subjective:     Patient ID: Kristen Morris, female    DOB: 05-08-1950, 71 y.o.   MRN: [id number]  Chief Complaint  Patient presents with   Foot Swelling    Patient complains of left foot swelling and pain for 3-4 days.     HPI  She says about 1-1/2 weeks ago she was on vacation and she was at [facility] when she suddenly felt like she had a "episode" in her chest.  She says it felt like it was jumping or skipping and then she suddenly felt like she was going to pass out.  She said eventually the symptoms faded away she did note she never lost consciousness.  But then a few days later started noticing that her left ankle and foot in particular were starting to feel sore and swollen.  Most of the soreness is a little bit more medial.  She said she is never had an episode where she thought she was going to pass out and she has not had previous problems with left ankle swelling.  She denies any recent injury or trauma.  She has been using ice and elevation and that does seem to help temporarily.  She also says that she just noticed that her fingernail on her fourth digit on her right hand felt sore and then the nail just fell off.   Patient is in today for swelling of left foot and ankle for 3-4 days.    ROS      Objective:    BP 119/78   Pulse 85   Resp 16   Ht 5\' 2"  (1.575 m)   Wt 140 lb (63.5 kg)   SpO2 96%   BMI 25.61 kg/m    Physical Exam Vitals and nursing note reviewed.  Constitutional:      Appearance: She is well-developed.  HENT:     Head: Normocephalic and atraumatic.  Cardiovascular:     Rate and Rhythm: Normal rate and regular rhythm.     Heart sounds: Normal heart sounds.  Pulmonary:     Effort: Pulmonary effort is normal.     Breath sounds: Normal breath sounds.  Musculoskeletal:     Comments: Left ankle with trace trace pitting medially she is tender just below the medial malleolus.  No rash or erythema.  No significant edema over the  foot.  Nontender along the shin.  Nontender calf squeeze.  Ankle with normal range of motion.  Skin:    General: Skin is warm and dry.  Neurological:     Mental Status: She is alert and oriented to person, place, and time.  Psychiatric:        Behavior: Behavior normal.     No results found for any visits on 07/09/21.      Assessment & Plan:   Problem List Items Addressed This Visit       Other   Heart palpitations - Primary   Other Visit Diagnoses     Near syncope       Left ankle swelling       Relevant Orders   DG Ankle Complete Left       Palpitations and near syncope-she just had one episode about a week and a half ago no recurrence since then.  We did discuss that if it does occur again to please let 09/09/21 know immediately as we can do additional work-up.  We did go ahead and do an EKG today which just showed  normal sinus rhythm with first-degree AV block.  Rate of 78 bpm no acute ST-T wave changes and no change from prior EKG 5 years ago in 2018.  Left medial ankle pain-unclear etiology.  She denies any injury or trauma.  She did do a lot of walking on her recent vacation so that certainly could have flared some underlying arthritis.  No prior history of gout etc.  We will get a plain film x-ray today.  She has been using ice and elevation.  Consider trial of an NSAID.   No orders of the defined types were placed in this encounter.   Return if symptoms worsen or fail to improve.  Nani Gasser, MD

## 2021-07-11 NOTE — Telephone Encounter (Signed)
Patient stated she had to have lab work done before prolia injection & no orders have been placed. I told patient I would send a message and call her back. AMUCK

## 2021-07-11 NOTE — Telephone Encounter (Signed)
Patient has been scheduled. AMUCK ?

## 2021-07-11 NOTE — Telephone Encounter (Signed)
Orders are in. Sorry I forgot when I was doing the [place].

## 2021-07-11 NOTE — Progress Notes (Signed)
Hi Kristen Morris, x-ray shows no fracture or dislocation.  You do have a very large heel spur that could be contributing to some posterior pain but your pain was more along the inner ankle.  There is some soft tissue swelling present.  No worrisome findings at this point so I would continue with elevating, anti-inflammatory and may be a Ace wrap for compression to help with swelling.  And see if it improves over the next couple of weeks if not we can get you in with our sports medicine doctor.

## 2021-07-11 NOTE — Telephone Encounter (Signed)
Call to [employer] (call ref# [id number]). There is an [place] on file dated Feb 07, 2021 thru Jan 06, 2022 for [id number] using authorization #:  [id number]  Patient may proceed with her prolia injection at her convenience as she was due any time after June 21st.

## 2021-07-12 DIAGNOSIS — M810 Age-related osteoporosis without current pathological fracture: Secondary | ICD-10-CM | POA: Diagnosis not present

## 2021-07-12 NOTE — Addendum Note (Signed)
Addended by: Chalmers Cater on: 07/12/2021 10:12 AM   Modules accepted: Orders

## 2021-07-13 LAB — COMPLETE METABOLIC PANEL WITH GFR
AG Ratio: 2 (calc) (ref 1.0–2.5)
ALT: 24 U/L (ref 6–29)
AST: 26 U/L (ref 10–35)
Albumin: 4.3 g/dL (ref 3.6–5.1)
Alkaline phosphatase (APISO): 39 U/L (ref 37–153)
BUN/Creatinine Ratio: 25 (calc) — ABNORMAL HIGH (ref 6–22)
BUN: 27 mg/dL — ABNORMAL HIGH (ref 7–25)
CO2: 25 mmol/L (ref 20–32)
Calcium: 9.7 mg/dL (ref 8.6–10.4)
Chloride: 106 mmol/L (ref 98–110)
Creat: 1.09 mg/dL — ABNORMAL HIGH (ref 0.60–1.00)
Globulin: 2.2 g/dL (calc) (ref 1.9–3.7)
Glucose, Bld: 80 mg/dL (ref 65–139)
Potassium: 4.5 mmol/L (ref 3.5–5.3)
Sodium: 141 mmol/L (ref 135–146)
Total Bilirubin: 0.4 mg/dL (ref 0.2–1.2)
Total Protein: 6.5 g/dL (ref 6.1–8.1)
eGFR: 54 mL/min/{1.73_m2} — ABNORMAL LOW (ref >=60)

## 2021-07-18 ENCOUNTER — Ambulatory Visit (INDEPENDENT_AMBULATORY_CARE_PROVIDER_SITE_OTHER): Payer: Medicare Other | Admitting: Physician Assistant

## 2021-07-18 VITALS — BP 126/84 | HR 94 | Temp 99.3°F | Ht 62.0 in | Wt 139.0 lb

## 2021-07-18 DIAGNOSIS — M810 Age-related osteoporosis without current pathological fracture: Secondary | ICD-10-CM | POA: Diagnosis not present

## 2021-07-18 MED ORDER — DENOSUMAB 60 MG/ML ~~LOC~~ SOSY
60.0000 mg | PREFILLED_SYRINGE | Freq: Once | SUBCUTANEOUS | Status: AC
  Administered 2021-07-18: 60 mg via SUBCUTANEOUS

## 2021-07-18 NOTE — Progress Notes (Signed)
Patient is here for a Prolia injection. Patient reports taking calcium and vitamin D daily. Patient's calcium levels and kidney function was within normal limits.   Patient tolerated injection on Left arm well without complications. Patient advised to schedule next injection in 6 months.   

## 2021-07-18 NOTE — Progress Notes (Signed)
Agree with above plan. 

## 2021-08-23 ENCOUNTER — Other Ambulatory Visit: Payer: Self-pay | Admitting: Physician Assistant

## 2021-08-23 DIAGNOSIS — G43001 Migraine without aura, not intractable, with status migrainosus: Secondary | ICD-10-CM

## 2021-08-23 DIAGNOSIS — E782 Mixed hyperlipidemia: Secondary | ICD-10-CM

## 2021-08-29 ENCOUNTER — Encounter: Payer: Self-pay | Admitting: General Practice

## 2021-08-29 NOTE — Telephone Encounter (Signed)
Patient did not want to schedule one at this time.

## 2021-09-05 ENCOUNTER — Other Ambulatory Visit: Payer: Self-pay | Admitting: Physician Assistant

## 2021-09-05 DIAGNOSIS — Z1231 Encounter for screening mammogram for malignant neoplasm of breast: Secondary | ICD-10-CM

## 2021-09-13 ENCOUNTER — Ambulatory Visit (INDEPENDENT_AMBULATORY_CARE_PROVIDER_SITE_OTHER): Payer: Medicare Other

## 2021-09-13 DIAGNOSIS — Z1231 Encounter for screening mammogram for malignant neoplasm of breast: Secondary | ICD-10-CM

## 2021-09-17 NOTE — Progress Notes (Signed)
Normal mammogram. Follow up in 1 year.

## 2021-10-25 ENCOUNTER — Telehealth: Payer: Self-pay | Admitting: Physician Assistant

## 2021-10-25 DIAGNOSIS — E782 Mixed hyperlipidemia: Secondary | ICD-10-CM

## 2021-10-25 DIAGNOSIS — Z1329 Encounter for screening for other suspected endocrine disorder: Secondary | ICD-10-CM

## 2021-10-25 DIAGNOSIS — Z131 Encounter for screening for diabetes mellitus: Secondary | ICD-10-CM

## 2021-10-25 DIAGNOSIS — Z0000 Encounter for general adult medical examination without abnormal findings: Secondary | ICD-10-CM

## 2021-10-25 NOTE — Telephone Encounter (Signed)
Patient is scheduled for physical on 11-16-21 she is requesting fasting labs be done before.

## 2021-10-25 NOTE — Telephone Encounter (Signed)
Labs ordered. Please let patient know she can come by to have them completed.

## 2021-10-31 DIAGNOSIS — Z131 Encounter for screening for diabetes mellitus: Secondary | ICD-10-CM | POA: Diagnosis not present

## 2021-10-31 DIAGNOSIS — Z0000 Encounter for general adult medical examination without abnormal findings: Secondary | ICD-10-CM | POA: Diagnosis not present

## 2021-10-31 DIAGNOSIS — Z1329 Encounter for screening for other suspected endocrine disorder: Secondary | ICD-10-CM | POA: Diagnosis not present

## 2021-10-31 DIAGNOSIS — E782 Mixed hyperlipidemia: Secondary | ICD-10-CM | POA: Diagnosis not present

## 2021-11-01 LAB — CBC WITH DIFFERENTIAL/PLATELET
Absolute Monocytes: 517 cells/uL (ref 200–950)
Basophils Absolute: 39 cells/uL (ref 0–200)
Basophils Relative: 0.7 %
Eosinophils Absolute: 182 cells/uL (ref 15–500)
Eosinophils Relative: 3.3 %
HCT: 38.1 % (ref 35.0–45.0)
Hemoglobin: 12.8 g/dL (ref 11.7–15.5)
Lymphs Abs: 2085 cells/uL (ref 850–3900)
MCH: 31.1 pg (ref 27.0–33.0)
MCHC: 33.6 g/dL (ref 32.0–36.0)
MCV: 92.5 fL (ref 80.0–100.0)
MPV: 10.1 fL (ref 7.5–12.5)
Monocytes Relative: 9.4 %
Neutro Abs: 2679 cells/uL (ref 1500–7800)
Neutrophils Relative %: 48.7 %
Platelets: 364 10*3/uL (ref 140–400)
RBC: 4.12 10*6/uL (ref 3.80–5.10)
RDW: 11.5 % (ref 11.0–15.0)
Total Lymphocyte: 37.9 %
WBC: 5.5 10*3/uL (ref 3.8–10.8)

## 2021-11-01 LAB — LIPID PANEL W/REFLEX DIRECT LDL
Cholesterol: 199 mg/dL (ref <200)
HDL: 57 mg/dL (ref >=50)
LDL Cholesterol (Calc): 120 mg/dL (calc) — ABNORMAL HIGH
Non-HDL Cholesterol (Calc): 142 mg/dL (calc) — ABNORMAL HIGH (ref <130)
Total CHOL/HDL Ratio: 3.5 (calc) (ref <5.0)
Triglycerides: 114 mg/dL (ref <150)

## 2021-11-01 LAB — COMPLETE METABOLIC PANEL WITH GFR
AG Ratio: 1.7 (calc) (ref 1.0–2.5)
ALT: 28 U/L (ref 6–29)
AST: 29 U/L (ref 10–35)
Albumin: 4.1 g/dL (ref 3.6–5.1)
Alkaline phosphatase (APISO): 39 U/L (ref 37–153)
BUN: 24 mg/dL (ref 7–25)
CO2: 26 mmol/L (ref 20–32)
Calcium: 9.5 mg/dL (ref 8.6–10.4)
Chloride: 107 mmol/L (ref 98–110)
Creat: 0.96 mg/dL (ref 0.60–1.00)
Globulin: 2.4 g/dL (calc) (ref 1.9–3.7)
Glucose, Bld: 88 mg/dL (ref 65–99)
Potassium: 4.3 mmol/L (ref 3.5–5.3)
Sodium: 141 mmol/L (ref 135–146)
Total Bilirubin: 0.3 mg/dL (ref 0.2–1.2)
Total Protein: 6.5 g/dL (ref 6.1–8.1)
eGFR: 63 mL/min/{1.73_m2} (ref >=60)

## 2021-11-01 LAB — TSH: TSH: 4.49 mIU/L (ref 0.40–4.50)

## 2021-11-02 NOTE — Progress Notes (Signed)
Kristen Morris,   We can talk more at upcoming appt.  Kidney function improved.  Glucose looks great.  Liver enzymes normal.  Normal hemoglobin.  Your HDL, good cholesterol, is great.  Your LDL, bad cholesterol, is up some.  Your CV risk is 10.2. I would like to discuss increasing your lovastatin.   [name].The 10-year ASCVD risk score (Arnett DK, et al., 2019) is: 10.2%   Values used to calculate the score:     Age: 71 years     Sex: Female     Is Non-Hispanic African American: No     Diabetic: No     Tobacco smoker: No     Systolic Blood Pressure: 357 mmHg     Is BP treated: No     HDL Cholesterol: 57 mg/dL     Total Cholesterol: 199 mg/dL

## 2021-11-16 ENCOUNTER — Ambulatory Visit (INDEPENDENT_AMBULATORY_CARE_PROVIDER_SITE_OTHER): Payer: Medicare Other | Admitting: Physician Assistant

## 2021-11-16 ENCOUNTER — Encounter: Payer: Self-pay | Admitting: Physician Assistant

## 2021-11-16 VITALS — BP 114/79 | HR 99 | Ht 62.0 in | Wt 138.1 lb

## 2021-11-16 DIAGNOSIS — Z0000 Encounter for general adult medical examination without abnormal findings: Secondary | ICD-10-CM

## 2021-11-16 DIAGNOSIS — Z23 Encounter for immunization: Secondary | ICD-10-CM

## 2021-11-16 DIAGNOSIS — G43001 Migraine without aura, not intractable, with status migrainosus: Secondary | ICD-10-CM | POA: Diagnosis not present

## 2021-11-16 DIAGNOSIS — E782 Mixed hyperlipidemia: Secondary | ICD-10-CM | POA: Diagnosis not present

## 2021-11-16 MED ORDER — VERAPAMIL HCL 80 MG PO TABS: 80.0000 mg | ORAL_TABLET | Freq: Two times a day (BID) | ORAL | 3 refills | Status: DC

## 2021-11-16 MED ORDER — LOVASTATIN 20 MG PO TABS: 20.0000 mg | ORAL_TABLET | Freq: Every day | ORAL | 3 refills | Status: DC

## 2021-11-16 NOTE — Patient Instructions (Signed)
Health Maintenance After Age 71 After age 71, you are at a higher risk for certain long-term diseases and infections as well as injuries from falls. Falls are a major cause of broken bones and head injuries in people who are older than age 71. Getting regular preventive care can help to keep you healthy and well. Preventive care includes getting regular testing and making lifestyle changes as recommended by your health care provider. Talk with your health care provider about: Which screenings and tests you should have. A screening is a test that checks for a disease when you have no symptoms. A diet and exercise plan that is right for you. What should I know about screenings and tests to prevent falls? Screening and testing are the best ways to find a health problem early. Early diagnosis and treatment give you the best chance of managing medical conditions that are common after age 71. Certain conditions and lifestyle choices may make you more likely to have a fall. Your health care provider may recommend: Regular vision checks. Poor vision and conditions such as cataracts can make you more likely to have a fall. If you wear glasses, make sure to get your prescription updated if your vision changes. Medicine review. Work with your health care provider to regularly review all of the medicines you are taking, including over-the-counter medicines. Ask your health care provider about any side effects that may make you more likely to have a fall. Tell your health care provider if any medicines that you take make you feel dizzy or sleepy. Strength and balance checks. Your health care provider may recommend certain tests to check your strength and balance while standing, walking, or changing positions. Foot health exam. Foot pain and numbness, as well as not wearing proper footwear, can make you more likely to have a fall. Screenings, including: Osteoporosis screening. Osteoporosis is a condition that causes  the bones to get weaker and break more easily. Blood pressure screening. Blood pressure changes and medicines to control blood pressure can make you feel dizzy. Depression screening. You may be more likely to have a fall if you have a fear of falling, feel depressed, or feel unable to do activities that you used to do. Alcohol use screening. Using too much alcohol can affect your balance and may make you more likely to have a fall. Follow these instructions at home: Lifestyle Do not drink alcohol if: Your health care provider tells you not to drink. If you drink alcohol: Limit how much you have to: 0-1 drink a day for women. 0-2 drinks a day for men. Know how much alcohol is in your drink. In the [place] one drink equals one 12 oz bottle of beer (355 mL), one 5 oz glass of wine (148 mL), or one 1 oz glass of hard liquor (44 mL). Do not use any products that contain nicotine or tobacco. These products include cigarettes, chewing tobacco, and vaping devices, such as e-cigarettes. If you need help quitting, ask your health care provider. Activity  Follow a regular exercise program to stay fit. This will help you maintain your balance. Ask your health care provider what types of exercise are appropriate for you. If you need a cane or walker, use it as recommended by your health care provider. Wear supportive shoes that have nonskid soles. Safety  Remove any tripping hazards, such as rugs, cords, and clutter. Install safety equipment such as grab bars in bathrooms and safety rails on stairs. Keep rooms and walkways   well-lit. General instructions Talk with your health care provider about your risks for falling. Tell your health care provider if: You fall. Be sure to tell your health care provider about all falls, even ones that seem minor. You feel dizzy, tiredness (fatigue), or off-balance. Take over-the-counter and prescription medicines only as told by your health care provider. These include  supplements. Eat a healthy diet and maintain a healthy weight. A healthy diet includes low-fat dairy products, low-fat (lean) meats, and fiber from whole grains, beans, and lots of fruits and vegetables. Stay current with your vaccines. Schedule regular health, dental, and eye exams. Summary Having a healthy lifestyle and getting preventive care can help to protect your health and wellness after age 71. Screening and testing are the best way to find a health problem early and help you avoid having a fall. Early diagnosis and treatment give you the best chance for managing medical conditions that are more common for people who are older than age 71. Falls are a major cause of broken bones and head injuries in people who are older than age 71. Take precautions to prevent a fall at home. Work with your health care provider to learn what changes you can make to improve your health and wellness and to prevent falls. This information is not intended to replace advice given to you by your health care provider. Make sure you discuss any questions you have with your health care provider. Document Revised: 05/15/2020 Document Reviewed: 05/15/2020 Elsevier Patient Education  2023 [employer]  

## 2021-11-16 NOTE — Progress Notes (Signed)
Complete physical exam  Patient: Kristen Morris   DOB: 24-Sep-1950   71 y.o. Female  MRN: [id number]  Subjective:    Chief Complaint  Patient presents with   Annual Exam    Kristen Morris is a 71 y.o. female who presents today for a complete physical exam. She reports consuming a general diet.  Very active with her grandchildren but no daily exercise.   She generally feels well. She reports sleeping well. She does not have additional problems to discuss today.    Most recent fall risk assessment:    11/16/2021   11:03 AM  Fall Risk   Falls in the past year? 1  Number falls in past yr: 0  Injury with Fall? 0  Risk for fall due to : No Fall Risks  Follow up Falls evaluation completed     Most recent depression screenings:    11/16/2021   11:25 AM 11/16/2021   11:02 AM  PHQ 2/9 Scores  PHQ - 2 Score 0 0  PHQ- 9 Score 1     Vision:Within last year and Dental: No current dental problems and Receives regular dental care  Patient Active Problem List   Diagnosis Date Noted   Chronic left hip pain 11/20/2020   Need for shingles vaccine 11/20/2020   Stress 11/16/2020   Chronic right hip pain 11/16/2020   Osteopenia 10/12/2020   Cyst of cervix 09/05/2017   Thoracic degenerative disc disease 09/05/2017   Acute right-sided low back pain without sciatica 09/03/2017   Mid back pain 09/03/2017   Left lower quadrant pain 09/03/2017   Moderate episode of recurrent major depressive disorder (HCC) 09/02/2017   Heart palpitations 09/01/2016   Osteoporosis, 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   Depression 05/24/2015   DDD (degenerative disc disease), lumbar 05/24/2015   Mixed hyperlipidemia 05/24/2015   Migraine without aura and with status migrainosus, not intractable 05/24/2015   Post-menopausal 05/24/2015   Family history of heart attack 05/24/2015   Osteoporosis 05/24/2015   Past Medical History:  Diagnosis Date   Hyperlipidemia    Family History  Problem Relation Age of  Onset   Heart attack Father    Cancer Brother    No Known Allergies    Patient Care Team: Nolene Ebbs as PCP - General (Family Medicine)   Outpatient Medications Prior to Visit  Medication Sig   AMBULATORY NON FORMULARY MEDICATION Shingles vaccine   APPLE CIDER VINEGAR PO Take by mouth daily.   Calcium 500 MG tablet Take 600 mg by mouth.   CINNAMON PO Take by mouth daily.   clobetasol cream (TEMOVATE) 0.05 % Apply 1 application topically 2 (two) times daily.   denosumab (PROLIA) 60 MG/ML SOLN injection Inject 60 mg into the skin every 6 (six) months. Administer in upper arm, thigh, or abdomen   Magnesium 100 MG CAPS Take by mouth.   meloxicam (MOBIC) 15 MG tablet Take 1 tablet (15 mg total) by mouth daily.   Multiple Vitamin tablet Take 1 tablet by mouth daily.   Zinc Sulfate (ZINC 15 PO) Take by mouth.   [DISCONTINUED] lovastatin (MEVACOR) 20 MG tablet TAKE 1 TABLET BY MOUTH AT  BEDTIME   [DISCONTINUED] verapamil (CALAN) 80 MG tablet TAKE 1 TABLET BY MOUTH TWICE  DAILY   No facility-administered medications prior to visit.    Review of Systems  All other systems reviewed and are negative.       Objective:     BP 114/79   Pulse 99  Ht 5\' 2"  (1.575 m)   Wt 138 lb 1.9 oz (62.7 kg)   SpO2 97%   BMI 25.26 kg/m  BP Readings from Last 3 Encounters:  11/16/21 114/79  07/18/21 126/84  07/09/21 119/78   Wt Readings from Last 3 Encounters:  11/16/21 138 lb 1.9 oz (62.7 kg)  07/18/21 139 lb 0.6 oz (63.1 kg)  07/09/21 140 lb (63.5 kg)      Physical Exam  BP 114/79   Pulse 99   Ht 5\' 2"  (1.575 m)   Wt 138 lb 1.9 oz (62.7 kg)   SpO2 97%   BMI 25.26 kg/m   General Appearance:    Alert, cooperative, no distress, appears stated age  Head:    Normocephalic, without obvious abnormality, atraumatic  Eyes:    PERRL, conjunctiva/corneas clear, EOM's intact, fundi    benign, both eyes  Ears:    Normal TM's and external ear canals, both ears  Nose:   Nares  normal, septum midline, mucosa normal, no drainage    or sinus tenderness  Throat:   Lips, mucosa, and tongue normal; teeth and gums normal  Neck:   Supple, symmetrical, trachea midline, no adenopathy;    thyroid:  no enlargement/tenderness/nodules; no carotid   bruit or JVD  Back:     Symmetric, no curvature, ROM normal, no CVA tenderness  Lungs:     Clear to auscultation bilaterally, respirations unlabored  Chest Wall:    No tenderness or deformity   Heart:    Regular rate and rhythm, S1 and S2 normal, no murmur, rub   or gallop     Abdomen:     Soft, non-tender, bowel sounds active all four quadrants,    no masses, no organomegaly        Extremities:   Extremities normal, atraumatic, no cyanosis or edema  Pulses:   2+ and symmetric all extremities  Skin:   Skin color, texture, turgor normal, no rashes or lesions  Lymph nodes:   Cervical, supraclavicular, and axillary nodes normal  Neurologic:   CNII-XII intact, normal strength, sensation and reflexes    throughout      Assessment & Plan:    Routine Health Maintenance and Physical Exam  Immunization History  Administered Date(s) Administered   Fluad Quad(high Dose 65+) 10/14/2018, 11/16/2019, 11/16/2020, 11/16/2021   Influenza,inj,Quad PF,6+ Mos 09/02/2017   Influenza-Unspecified 09/02/2017, 10/14/2018, 11/16/2019   PFIZER(Purple Top)SARS-COV-2 Vaccination 03/05/2019, 03/31/2019   Pneumococcal Conjugate-13 08/28/2015   Pneumococcal Polysaccharide-23 09/02/2017   Tdap 03/07/2016    Health Maintenance  Topic Date Due   Medicare Annual Wellness (AWV)  06/19/2021   COVID-19 Vaccine (3 - Pfizer series) 12/02/2021 (Originally 05/26/2019)   Zoster Vaccines- Shingrix (1 of 2) 02/16/2022 (Originally 04/20/2000)   MAMMOGRAM  09/14/2022   DEXA SCAN  10/12/2022   Fecal DNA (Cologuard)  06/16/2023   TETANUS/TDAP  03/08/2026   Pneumonia Vaccine 38+ Years old  Completed   INFLUENZA VACCINE  Completed   Hepatitis C Screening   Completed   HPV VACCINES  Aged Out    Discussed health benefits of physical activity, and encouraged her to engage in regular exercise appropriate for her age and condition.  05/01/202876 was seen today for annual exam.  Diagnoses and all orders for this visit:  Routine physical examination -     lovastatin (MEVACOR) 20 MG tablet; Take 1 tablet (20 mg total) by mouth at bedtime. -     verapamil (CALAN) 80 MG tablet; Take 1 tablet (80  mg total) by mouth 2 (two) times daily. -     Flu Vaccine QUAD High Dose(Fluad)  Mixed hyperlipidemia -     lovastatin (MEVACOR) 20 MG tablet; Take 1 tablet (20 mg total) by mouth at bedtime.  Migraine without aura and with status migrainosus, not intractable -     verapamil (CALAN) 80 MG tablet; Take 1 tablet (80 mg total) by mouth 2 (two) times daily.  Need for immunization against influenza -     Flu Vaccine QUAD High Dose(Fluad)   .[name] Discussed 150 minutes of exercise a week.  Encouraged vitamin D 1000 units and Calcium 1300mg  or 4 servings of dairy a day.  Fasting labs reviewed LDL up some . The 10-year ASCVD risk score (Arnett DK, et al., 2019) is: 8.4%   Values used to calculate the score:     Age: 69 years     Sex: Female     Is Non-Hispanic African American: No     Diabetic: No     Tobacco smoker: No     Systolic Blood Pressure: 114 mmHg     Is BP treated: No     HDL Cholesterol: 57 mg/dL     Total Cholesterol: 199 mg/dL Discussed risk Ok with staying on the same dose of lovastatin Flu shot given today Will get shingles after it has been 1 year since getting shingles Bone density and mammogram UTD Cologuard UTD Declined covid vaccine  Vitals look good and migraines controlled Verapamil refilled     62, PA-C

## 2022-01-29 ENCOUNTER — Telehealth: Payer: Self-pay | Admitting: Physician Assistant

## 2022-01-29 DIAGNOSIS — M810 Age-related osteoporosis without current pathological fracture: Secondary | ICD-10-CM

## 2022-01-29 DIAGNOSIS — M25551 Pain in right hip: Secondary | ICD-10-CM | POA: Diagnosis not present

## 2022-01-29 NOTE — Telephone Encounter (Signed)
Per patient stated needs shot but prior to she needs lab work as well need active order before scheduling can you assist with that?

## 2022-01-29 NOTE — Telephone Encounter (Signed)
Please review is authorization needed?

## 2022-01-29 NOTE — Telephone Encounter (Signed)
She is due for Prolia. Ordered CMP, please make sure this is covered before we call patient back to schedule nurse visit.

## 2022-02-05 ENCOUNTER — Telehealth: Payer: Self-pay

## 2022-02-05 NOTE — Telephone Encounter (Signed)
Initiated Prior authorization [id number] 60MG /ML syringes Via: Covermymeds Case/Key:[id number] Status: approved  as of 02/05/22 Reason:Available without authorization. Notified Pt via: Mychart  Cost breakdown  20% of pt cost  Which around $291 Called pt, pt is agreeable with cost amount of the injection

## 2022-02-08 DIAGNOSIS — M810 Age-related osteoporosis without current pathological fracture: Secondary | ICD-10-CM | POA: Diagnosis not present

## 2022-02-09 LAB — COMPLETE METABOLIC PANEL WITH GFR
AG Ratio: 1.7 (calc) (ref 1.0–2.5)
ALT: 24 U/L (ref 6–29)
AST: 25 U/L (ref 10–35)
Albumin: 4.2 g/dL (ref 3.6–5.1)
Alkaline phosphatase (APISO): 40 U/L (ref 37–153)
BUN/Creatinine Ratio: 28 (calc) — ABNORMAL HIGH (ref 6–22)
BUN: 30 mg/dL — ABNORMAL HIGH (ref 7–25)
CO2: 27 mmol/L (ref 20–32)
Calcium: 10.4 mg/dL (ref 8.6–10.4)
Chloride: 106 mmol/L (ref 98–110)
Creat: 1.08 mg/dL — ABNORMAL HIGH (ref 0.60–1.00)
Globulin: 2.5 g/dL (calc) (ref 1.9–3.7)
Glucose, Bld: 80 mg/dL (ref 65–99)
Potassium: 4.2 mmol/L (ref 3.5–5.3)
Sodium: 144 mmol/L (ref 135–146)
Total Bilirubin: 0.3 mg/dL (ref 0.2–1.2)
Total Protein: 6.7 g/dL (ref 6.1–8.1)
eGFR: 55 mL/min/{1.73_m2} — ABNORMAL LOW (ref >=60)

## 2022-02-11 NOTE — Progress Notes (Signed)
[place] for prolia. Calcium looks fine. Kidney function down again. Make sure you are drinking enough. Your kidneys look dry.

## 2022-02-19 ENCOUNTER — Ambulatory Visit (INDEPENDENT_AMBULATORY_CARE_PROVIDER_SITE_OTHER): Payer: Medicare HMO | Admitting: Family Medicine

## 2022-02-19 ENCOUNTER — Ambulatory Visit: Payer: Medicare Other

## 2022-02-19 VITALS — BP 136/85 | HR 93 | Ht 62.0 in | Wt 140.0 lb

## 2022-02-19 DIAGNOSIS — M818 Other osteoporosis without current pathological fracture: Secondary | ICD-10-CM | POA: Diagnosis not present

## 2022-02-19 MED ORDER — DENOSUMAB 60 MG/ML ~~LOC~~ SOSY
60.0000 mg | PREFILLED_SYRINGE | Freq: Once | SUBCUTANEOUS | Status: AC
  Administered 2022-02-19: 60 mg via SUBCUTANEOUS

## 2022-02-19 NOTE — Progress Notes (Signed)
   Subjective:    Patient ID: Kristen Morris, female    DOB: 07-11-1950, 72 y.o.   MRN: [id number]  HPI Patient is here for a Prolia injection. Patient reports taking calcium and vitamin D daily. Patient's calcium levels and kidney function are within normal limits.     Review of Systems     Objective:   Physical Exam        Assessment & Plan:   Patient tolerated injection in right arm well without complications. Patient advised to schedule next injection in 6 months on or after 08/21/2022.

## 2022-02-19 NOTE — Progress Notes (Signed)
Agree with documentation as above.   Mischelle Reeg, MD  

## 2022-03-12 DIAGNOSIS — R519 Headache, unspecified: Secondary | ICD-10-CM | POA: Diagnosis not present

## 2022-03-12 DIAGNOSIS — Z03818 Encounter for observation for suspected exposure to other biological agents ruled out: Secondary | ICD-10-CM | POA: Diagnosis not present

## 2022-03-12 DIAGNOSIS — R112 Nausea with vomiting, unspecified: Secondary | ICD-10-CM | POA: Diagnosis not present

## 2022-03-15 ENCOUNTER — Ambulatory Visit (INDEPENDENT_AMBULATORY_CARE_PROVIDER_SITE_OTHER): Payer: Medicare HMO | Admitting: Medical-Surgical

## 2022-03-15 ENCOUNTER — Encounter: Payer: Self-pay | Admitting: Medical-Surgical

## 2022-03-15 VITALS — BP 124/82 | HR 97 | Temp 98.8°F | Resp 20 | Ht 62.0 in | Wt 140.6 lb

## 2022-03-15 DIAGNOSIS — R110 Nausea: Secondary | ICD-10-CM | POA: Diagnosis not present

## 2022-03-15 DIAGNOSIS — R5383 Other fatigue: Secondary | ICD-10-CM

## 2022-03-15 DIAGNOSIS — R519 Headache, unspecified: Secondary | ICD-10-CM | POA: Diagnosis not present

## 2022-03-15 DIAGNOSIS — R509 Fever, unspecified: Secondary | ICD-10-CM

## 2022-03-15 LAB — POCT URINALYSIS DIP (CLINITEK)
Bilirubin, UA: NEGATIVE
Glucose, UA: NEGATIVE mg/dL
Ketones, POC UA: NEGATIVE mg/dL
Nitrite, UA: NEGATIVE
POC PROTEIN,UA: NEGATIVE
Spec Grav, UA: 1.025 (ref 1.010–1.025)
Urobilinogen, UA: 1 E.U./dL
pH, UA: 6.5 (ref 5.0–8.0)

## 2022-03-15 NOTE — Progress Notes (Signed)
Established Patient Office Visit  Subjective   Patient ID: Kristen Morris, female   DOB: [birth year] Age: 73 y.o. MRN: [id number]   Chief Complaint  Patient presents with   Headache   Nausea   Fatigue   HPI Pleasant 72 year old female presenting today for evaluation of fever, chills, nausea, vomiting, and headaches.  She went to urgent care in [facility] on Tuesday for COVID and flu with negative results.  Symptoms started on Tuesday.  Continues to run a fever Tmax 101.  Has been taking Tylenol for her fever with some relief. Took husband's nausea medication once but it knocked her out.  She is very concerned that she could possibly have a urinary tract infection or some laboratory abnormality that would be making her feel this awful.  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  Denies sinus congestion, cough, chest congestion, ear pain/pressure, sore throat, and diarrhea.   Objective:    Vitals:   03/15/22 1037  BP: 124/82  Pulse: 97  Temp: 98.8 F (37.1 C)  Resp: 20  Height: '5\' 2"'$  (1.575 m)  Weight: 140 lb 9.6 oz (63.8 kg)  SpO2: 98%  BMI (Calculated): 25.71   Physical Exam Vitals reviewed.  Constitutional:      General: She is not in acute distress.    Appearance: Normal appearance. She is well-developed. She is not ill-appearing.  HENT:     Head: Normocephalic and atraumatic.  Cardiovascular:     Rate and Rhythm: Normal rate and regular rhythm.     Pulses: Normal pulses.     Heart sounds: Normal heart sounds.  Pulmonary:     Effort: Pulmonary effort is normal. No respiratory distress.     Breath sounds: Normal breath sounds. No wheezing, rhonchi or rales.  Skin:    General: Skin is warm and dry.  Neurological:     Mental Status: She is alert and oriented to person, place, and time.  Psychiatric:        Mood and Affect: Mood normal.        Behavior: Behavior normal.         Thought Content: Thought content normal.        Judgment: Judgment normal.   No results found for this or any previous visit (from the past 24 hour(s)).     The 10-year ASCVD risk score (Arnett DK, et al., 2019) is: 9.9%   Values used to calculate the score:     Age: 57 years     Sex: Female     Is Non-Hispanic African American: No     Diabetic: No     Tobacco smoker: No     Systolic Blood Pressure: [id number] mmHg     Is BP treated: No     HDL Cholesterol: 57 mg/dL     Total Cholesterol: 199 mg/dL   Assessment & Plan:   1. Fever and chills 2. Acute nonintractable headache, unspecified headache type 3. Fatigue, unspecified type 4. Nausea Unclear etiology.  Suspect possible viral illness versus UTI.  Checking labs as below.  POCT urinalysis completed today, positive for small leukocytes.  Sending for culture.  Offered antiemetic, patient declined stating the nausea was not that bad.  Okay to continue Tylenol as needed for fever management. - CBC with Differential/Platelet - COMPLETE METABOLIC PANEL WITH GFR - POCT Urinalysis Dipstick  Return if symptoms  worsen or fail to improve.  ___________________________________________ Clearnce Sorrel, DNP, APRN, FNP-BC [facility]

## 2022-03-16 LAB — COMPLETE METABOLIC PANEL WITH GFR
AG Ratio: 1.7 (calc) (ref 1.0–2.5)
ALT: 33 U/L — ABNORMAL HIGH (ref 6–29)
AST: 25 U/L (ref 10–35)
Albumin: 4 g/dL (ref 3.6–5.1)
Alkaline phosphatase (APISO): 51 U/L (ref 37–153)
BUN: 16 mg/dL (ref 7–25)
CO2: 24 mmol/L (ref 20–32)
Calcium: 9.5 mg/dL (ref 8.6–10.4)
Chloride: 106 mmol/L (ref 98–110)
Creat: 0.85 mg/dL (ref 0.60–1.00)
Globulin: 2.4 g/dL (calc) (ref 1.9–3.7)
Glucose, Bld: 98 mg/dL (ref 65–99)
Potassium: 4.1 mmol/L (ref 3.5–5.3)
Sodium: 140 mmol/L (ref 135–146)
Total Bilirubin: 0.4 mg/dL (ref 0.2–1.2)
Total Protein: 6.4 g/dL (ref 6.1–8.1)
eGFR: 73 mL/min/{1.73_m2} (ref >=60)

## 2022-03-16 LAB — CBC WITH DIFFERENTIAL/PLATELET
Absolute Monocytes: 889 cells/uL (ref 200–950)
Basophils Absolute: 39 cells/uL (ref 0–200)
Basophils Relative: 0.5 %
Eosinophils Absolute: 211 cells/uL (ref 15–500)
Eosinophils Relative: 2.7 %
HCT: 36.8 % (ref 35.0–45.0)
Hemoglobin: 12.3 g/dL (ref 11.7–15.5)
Lymphs Abs: 2293 cells/uL (ref 850–3900)
MCH: 31.1 pg (ref 27.0–33.0)
MCHC: 33.4 g/dL (ref 32.0–36.0)
MCV: 92.9 fL (ref 80.0–100.0)
MPV: 9.8 fL (ref 7.5–12.5)
Monocytes Relative: 11.4 %
Neutro Abs: 4368 cells/uL (ref 1500–7800)
Neutrophils Relative %: 56 %
Platelets: 312 10*3/uL (ref 140–400)
RBC: 3.96 10*6/uL (ref 3.80–5.10)
RDW: 11.5 % (ref 11.0–15.0)
Total Lymphocyte: 29.4 %
WBC: 7.8 10*3/uL (ref 3.8–10.8)

## 2022-03-17 LAB — URINE CULTURE
MICRO NUMBER:: 14668522
SPECIMEN QUALITY:: ADEQUATE

## 2022-03-18 MED ORDER — SULFAMETHOXAZOLE-TRIMETHOPRIM 800-160 MG PO TABS: 1.0000 | ORAL_TABLET | Freq: Two times a day (BID) | ORAL | 0 refills | Status: DC

## 2022-03-18 NOTE — Addendum Note (Signed)
Addended bySamuel Bouche on: 03/18/2022 07:30 AM   Modules accepted: Orders

## 2022-04-10 ENCOUNTER — Telehealth: Payer: Self-pay | Admitting: Physician Assistant

## 2022-04-10 NOTE — Telephone Encounter (Signed)
Called patient to schedule Medicare Annual Wellness Visit (AWV). Left message for patient to call back and schedule Medicare Annual Wellness Visit (AWV).  Last date of AWV: 2022  Please schedule an appointment at any time with [facility].  If any questions, please contact me at [phone].  Thank you ,  Lin Givens Patient Access Advocate II Direct Dial: [phone]

## 2022-04-26 ENCOUNTER — Telehealth: Payer: Self-pay | Admitting: Physician Assistant

## 2022-04-26 DIAGNOSIS — E782 Mixed hyperlipidemia: Secondary | ICD-10-CM

## 2022-04-26 DIAGNOSIS — Z0000 Encounter for general adult medical examination without abnormal findings: Secondary | ICD-10-CM

## 2022-04-26 MED ORDER — LOVASTATIN 20 MG PO TABS: 20.0000 mg | ORAL_TABLET | Freq: Every day | ORAL | 3 refills | Status: DC

## 2022-04-26 NOTE — Telephone Encounter (Signed)
Patient called requesting a refill of ; Lovastatin   Pharmacy ;  [facility]  In [facility] [place] she has 6 pills left Would like them sent over then for future refills please submit to [facility] [phone]

## 2022-05-30 ENCOUNTER — Other Ambulatory Visit: Payer: Self-pay | Admitting: Physician Assistant

## 2022-05-30 DIAGNOSIS — Z1231 Encounter for screening mammogram for malignant neoplasm of breast: Secondary | ICD-10-CM

## 2022-06-04 DIAGNOSIS — H2513 Age-related nuclear cataract, bilateral: Secondary | ICD-10-CM | POA: Diagnosis not present

## 2022-06-04 DIAGNOSIS — H5203 Hypermetropia, bilateral: Secondary | ICD-10-CM | POA: Diagnosis not present

## 2022-06-04 DIAGNOSIS — H524 Presbyopia: Secondary | ICD-10-CM | POA: Diagnosis not present

## 2022-07-09 ENCOUNTER — Telehealth: Payer: Self-pay | Admitting: Physician Assistant

## 2022-07-09 DIAGNOSIS — E782 Mixed hyperlipidemia: Secondary | ICD-10-CM

## 2022-07-09 DIAGNOSIS — Z0000 Encounter for general adult medical examination without abnormal findings: Secondary | ICD-10-CM

## 2022-07-09 MED ORDER — LOVASTATIN 20 MG PO TABS: 20.0000 mg | ORAL_TABLET | Freq: Every day | ORAL | 3 refills | Status: DC

## 2022-07-09 NOTE — Telephone Encounter (Signed)
Rx sent 

## 2022-07-09 NOTE — Telephone Encounter (Signed)
Patient called requesting a refill of lovastatin (MEVACOR) 20 MG tablet [[id number]]   Pharmacy ;  [id number], [place] - [address]

## 2022-08-21 ENCOUNTER — Ambulatory Visit: Payer: Medicare HMO

## 2022-08-21 ENCOUNTER — Other Ambulatory Visit: Payer: Self-pay

## 2022-08-21 ENCOUNTER — Telehealth: Payer: Self-pay

## 2022-08-21 DIAGNOSIS — M818 Other osteoporosis without current pathological fracture: Secondary | ICD-10-CM

## 2022-08-21 NOTE — Telephone Encounter (Signed)
PA approved until 01/07/2023.  Approval # [id number]

## 2022-08-21 NOTE — Progress Notes (Signed)
Labs ordered for Prolia injection.

## 2022-08-22 LAB — CMP14+EGFR
ALT: 31 IU/L (ref 0–32)
AST: 30 IU/L (ref 0–40)
Albumin: 4.3 g/dL (ref 3.8–4.8)
Alkaline Phosphatase: 47 IU/L (ref 44–121)
BUN/Creatinine Ratio: 21 (ref 12–28)
BUN: 24 mg/dL (ref 8–27)
Bilirubin Total: 0.3 mg/dL (ref 0.0–1.2)
CO2: 20 mmol/L (ref 20–29)
Calcium: 10.2 mg/dL (ref 8.7–10.3)
Chloride: 104 mmol/L (ref 96–106)
Creatinine, Ser: 1.12 mg/dL — ABNORMAL HIGH (ref 0.57–1.00)
Globulin, Total: 2.2 g/dL (ref 1.5–4.5)
Glucose: 96 mg/dL (ref 70–99)
Potassium: 4.6 mmol/L (ref 3.5–5.2)
Sodium: 140 mmol/L (ref 134–144)
Total Protein: 6.5 g/dL (ref 6.0–8.5)
eGFR: 52 mL/min/{1.73_m2} — ABNORMAL LOW (ref >59)

## 2022-08-22 NOTE — Progress Notes (Signed)
Calcium looks good. Kidneys look dry. Make sure staying hydrated. Recheck in one month to make sure improving.

## 2022-08-28 ENCOUNTER — Ambulatory Visit (INDEPENDENT_AMBULATORY_CARE_PROVIDER_SITE_OTHER): Payer: Medicare HMO | Admitting: Physician Assistant

## 2022-08-28 VITALS — BP 121/71 | HR 84

## 2022-08-28 DIAGNOSIS — M818 Other osteoporosis without current pathological fracture: Secondary | ICD-10-CM | POA: Diagnosis not present

## 2022-08-28 MED ORDER — DENOSUMAB 60 MG/ML ~~LOC~~ SOSY
60.0000 mg | PREFILLED_SYRINGE | Freq: Once | SUBCUTANEOUS | Status: AC
Start: 2022-08-28 — End: 2022-08-28
  Administered 2022-08-28: 60 mg via SUBCUTANEOUS

## 2022-08-28 NOTE — Progress Notes (Signed)
Agree with above plan. 

## 2022-08-28 NOTE — Progress Notes (Signed)
   Established Patient Office Visit  Subjective   Patient ID: Kristen Morris, female    DOB: [birth year]  Age: 72 y.o. MRN: [id number]  Chief Complaint  Patient presents with   Osteoporosis    HPI  Charrelle Prechtel is here for Prolia injection. She states she does take calcium and vitamin D daily.   ROS    Objective:     BP 121/71   Pulse 84   SpO2 97%    Physical Exam   No results found for any visits on 08/28/22.    The 10-year ASCVD risk score (Arnett DK, et al., 2019) is: 10.5%    Assessment & Plan:  Prolia injection - Patient tolerated injection well without complications. Patient advised to schedule next injection 6 months and 1 day from today.    Problem List Items Addressed This Visit       Unprioritized   Osteoporosis - Primary    Return in about 6 months (around 03/01/2023) for Prolia injection. Earna Coder, Janalyn Harder, CMA

## 2022-09-18 ENCOUNTER — Ambulatory Visit (INDEPENDENT_AMBULATORY_CARE_PROVIDER_SITE_OTHER): Payer: Medicare HMO

## 2022-09-18 DIAGNOSIS — Z1231 Encounter for screening mammogram for malignant neoplasm of breast: Secondary | ICD-10-CM

## 2022-09-20 ENCOUNTER — Other Ambulatory Visit: Payer: Self-pay | Admitting: Physician Assistant

## 2022-09-20 DIAGNOSIS — R928 Other abnormal and inconclusive findings on diagnostic imaging of breast: Secondary | ICD-10-CM

## 2022-09-20 NOTE — Progress Notes (Signed)
Kristen Morris,   Asymmetries were found in your right breast and need more imaging to be evaluated. The imaging clinic will reach out and schedule.

## 2022-09-24 ENCOUNTER — Telehealth: Payer: Self-pay | Admitting: Physician Assistant

## 2022-09-24 DIAGNOSIS — Z0000 Encounter for general adult medical examination without abnormal findings: Secondary | ICD-10-CM

## 2022-09-24 DIAGNOSIS — G43001 Migraine without aura, not intractable, with status migrainosus: Secondary | ICD-10-CM

## 2022-09-24 NOTE — Telephone Encounter (Signed)
Prescription Request  09/24/2022  LOV: 08/28/2022  What is the name of the medication or equipment? verapamil (CALAN) 80 MG tablet   Have you contacted your pharmacy to request a refill? Yes   Which pharmacy would you like this sent to?   [id number],  - [address] [place] [address] Phone: [phone] Fax: [phone]   Patient notified that their request is being sent to the clinical staff for review and that they should receive a response within 2 business days.   Please advise at [facility] [phone]

## 2022-09-25 MED ORDER — VERAPAMIL HCL 80 MG PO TABS
80.0000 mg | ORAL_TABLET | Freq: Two times a day (BID) | ORAL | 0 refills | Status: DC
Start: 2022-09-25 — End: 2022-12-25

## 2022-09-25 NOTE — Telephone Encounter (Signed)
Refilled for 90/d until patient can be seen .

## 2022-09-26 ENCOUNTER — Ambulatory Visit
Admission: RE | Admit: 2022-09-26 | Discharge: 2022-09-26 | Disposition: A | Discharge status: Home / Self Care | Payer: Medicare HMO | Source: Ambulatory Visit | Attending: Physician Assistant | Admitting: Physician Assistant

## 2022-09-26 DIAGNOSIS — R928 Other abnormal and inconclusive findings on diagnostic imaging of breast: Secondary | ICD-10-CM

## 2022-09-27 ENCOUNTER — Encounter: Payer: Self-pay | Admitting: Physician Assistant

## 2022-09-27 DIAGNOSIS — N6001 Solitary cyst of right breast: Secondary | ICD-10-CM | POA: Insufficient documentation

## 2022-09-27 NOTE — Progress Notes (Signed)
GREAT news. Right breast benign cyst. Back to screenings in one year.

## 2022-11-05 ENCOUNTER — Telehealth: Payer: Self-pay | Admitting: Physician Assistant

## 2022-11-05 DIAGNOSIS — Z0000 Encounter for general adult medical examination without abnormal findings: Secondary | ICD-10-CM

## 2022-11-05 NOTE — Telephone Encounter (Signed)
Patient is requesting fasting labs before her appointment

## 2022-11-14 DIAGNOSIS — Z0000 Encounter for general adult medical examination without abnormal findings: Secondary | ICD-10-CM | POA: Diagnosis not present

## 2022-11-15 ENCOUNTER — Encounter: Payer: Self-pay | Admitting: Physician Assistant

## 2022-11-15 LAB — CBC WITH DIFFERENTIAL/PLATELET
Basophils Absolute: 0 10*3/uL (ref 0.0–0.2)
Basos: 1 %
EOS (ABSOLUTE): 0.2 10*3/uL (ref 0.0–0.4)
Eos: 4 %
Hematocrit: 38.6 % (ref 34.0–46.6)
Hemoglobin: 12.4 g/dL (ref 11.1–15.9)
Immature Grans (Abs): 0 10*3/uL (ref 0.0–0.1)
Immature Granulocytes: 0 %
Lymphocytes Absolute: 2 10*3/uL (ref 0.7–3.1)
Lymphs: 37 %
MCH: 31.2 pg (ref 26.6–33.0)
MCHC: 32.1 g/dL (ref 31.5–35.7)
MCV: 97 fL (ref 79–97)
Monocytes Absolute: 0.5 10*3/uL (ref 0.1–0.9)
Monocytes: 9 %
Neutrophils Absolute: 2.6 10*3/uL (ref 1.4–7.0)
Neutrophils: 49 %
Platelets: 350 10*3/uL (ref 150–450)
RBC: 3.97 x10E6/uL (ref 3.77–5.28)
RDW: 11.7 % (ref 11.7–15.4)
WBC: 5.3 10*3/uL (ref 3.4–10.8)

## 2022-11-15 LAB — LIPID PANEL
Chol/HDL Ratio: 3.4 ratio (ref 0.0–4.4)
Cholesterol, Total: 184 mg/dL (ref 100–199)
HDL: 54 mg/dL (ref >39)
LDL Chol Calc (NIH): 106 mg/dL — ABNORMAL HIGH (ref 0–99)
Triglycerides: 134 mg/dL (ref 0–149)
VLDL Cholesterol Cal: 24 mg/dL (ref 5–40)

## 2022-11-15 LAB — TSH: TSH: 3.68 u[IU]/mL (ref 0.450–4.500)

## 2022-11-18 ENCOUNTER — Encounter: Payer: Self-pay | Admitting: Physician Assistant

## 2022-11-18 ENCOUNTER — Ambulatory Visit (INDEPENDENT_AMBULATORY_CARE_PROVIDER_SITE_OTHER): Payer: Medicare HMO | Admitting: Physician Assistant

## 2022-11-18 VITALS — BP 128/81 | HR 90 | Ht 62.0 in | Wt 143.0 lb

## 2022-11-18 DIAGNOSIS — Z0000 Encounter for general adult medical examination without abnormal findings: Secondary | ICD-10-CM

## 2022-11-18 DIAGNOSIS — M810 Age-related osteoporosis without current pathological fracture: Secondary | ICD-10-CM

## 2022-11-18 NOTE — Patient Instructions (Signed)
Health Maintenance After Age 72 After age 72, you are at a higher risk for certain long-term diseases and infections as well as injuries from falls. Falls are a major cause of broken bones and head injuries in people who are older than age 72. Getting regular preventive care can help to keep you healthy and well. Preventive care includes getting regular testing and making lifestyle changes as recommended by your health care provider. Talk with your health care provider about: Which screenings and tests you should have. A screening is a test that checks for a disease when you have no symptoms. A diet and exercise plan that is right for you. What should I know about screenings and tests to prevent falls? Screening and testing are the best ways to find a health problem early. Early diagnosis and treatment give you the best chance of managing medical conditions that are common after age 72. Certain conditions and lifestyle choices may make you more likely to have a fall. Your health care provider may recommend: Regular vision checks. Poor vision and conditions such as cataracts can make you more likely to have a fall. If you wear glasses, make sure to get your prescription updated if your vision changes. Medicine review. Work with your health care provider to regularly review all of the medicines you are taking, including over-the-counter medicines. Ask your health care provider about any side effects that may make you more likely to have a fall. Tell your health care provider if any medicines that you take make you feel dizzy or sleepy. Strength and balance checks. Your health care provider may recommend certain tests to check your strength and balance while standing, walking, or changing positions. Foot health exam. Foot pain and numbness, as well as not wearing proper footwear, can make you more likely to have a fall. Screenings, including: Osteoporosis screening. Osteoporosis is a condition that causes  the bones to get weaker and break more easily. Blood pressure screening. Blood pressure changes and medicines to control blood pressure can make you feel dizzy. Depression screening. You may be more likely to have a fall if you have a fear of falling, feel depressed, or feel unable to do activities that you used to do. Alcohol use screening. Using too much alcohol can affect your balance and may make you more likely to have a fall. Follow these instructions at home: Lifestyle Do not drink alcohol if: Your health care provider tells you not to drink. If you drink alcohol: Limit how much you have to: 0-1 drink a day for women. 0-2 drinks a day for men. Know how much alcohol is in your drink. In the [place] one drink equals one 12 oz bottle of beer (355 mL), one 5 oz glass of wine (148 mL), or one 1 oz glass of hard liquor (44 mL). Do not use any products that contain nicotine or tobacco. These products include cigarettes, chewing tobacco, and vaping devices, such as e-cigarettes. If you need help quitting, ask your health care provider. Activity  Follow a regular exercise program to stay fit. This will help you maintain your balance. Ask your health care provider what types of exercise are appropriate for you. If you need a cane or walker, use it as recommended by your health care provider. Wear supportive shoes that have nonskid soles. Safety  Remove any tripping hazards, such as rugs, cords, and clutter. Install safety equipment such as grab bars in bathrooms and safety rails on stairs. Keep rooms and walkways   well-lit. General instructions Talk with your health care provider about your risks for falling. Tell your health care provider if: You fall. Be sure to tell your health care provider about all falls, even ones that seem minor. You feel dizzy, tiredness (fatigue), or off-balance. Take over-the-counter and prescription medicines only as told by your health care provider. These include  supplements. Eat a healthy diet and maintain a healthy weight. A healthy diet includes low-fat dairy products, low-fat (lean) meats, and fiber from whole grains, beans, and lots of fruits and vegetables. Stay current with your vaccines. Schedule regular health, dental, and eye exams. Summary Having a healthy lifestyle and getting preventive care can help to protect your health and wellness after age 72. Screening and testing are the best way to find a health problem early and help you avoid having a fall. Early diagnosis and treatment give you the best chance for managing medical conditions that are more common for people who are older than age 72. Falls are a major cause of broken bones and head injuries in people who are older than age 72. Take precautions to prevent a fall at home. Work with your health care provider to learn what changes you can make to improve your health and wellness and to prevent falls. This information is not intended to replace advice given to you by your health care provider. Make sure you discuss any questions you have with your health care provider. Document Revised: 05/15/2020 Document Reviewed: 05/15/2020 Elsevier Patient Education  2024 [employer]vier Inc.  

## 2022-11-18 NOTE — Progress Notes (Signed)
Will discuss at appt today.   [name].The 10-year ASCVD risk score (Arnett DK, et al., 2019) is: 10.4%   Values used to calculate the score:     Age: 72 years     Sex: Female     Is Non-Hispanic African American: No     Diabetic: No     Tobacco smoker: No     Systolic Blood Pressure: 121 mmHg     Is BP treated: No     HDL Cholesterol: 54 mg/dL     Total Cholesterol: 184 mg/dL

## 2022-11-18 NOTE — Progress Notes (Signed)
Complete physical exam  Patient: Kristen Morris   DOB: 1950-12-29   72 y.o. Female  MRN: [id number]  Subjective:    Chief Complaint  Patient presents with   Annual Exam    Kristen Morris is a 72 y.o. female who presents today for a complete physical exam. She reports consuming a general diet.  Pt walks regularly and exercises keeping her young grandchild.  She generally feels well. She reports sleeping well. She does not have additional problems to discuss today.    Most recent fall risk assessment:    03/15/2022   10:38 AM  Fall Risk   Falls in the past year? 0  Number falls in past yr: 0  Injury with Fall? 0  Risk for fall due to : No Fall Risks  Follow up Falls evaluation completed     Most recent depression screenings:    03/15/2022   10:38 AM 11/16/2021   11:25 AM  PHQ 2/9 Scores  PHQ - 2 Score 0 0  PHQ- 9 Score  1    Vision:Within last year and Dental: No current dental problems and Receives regular dental care  Patient Active Problem List   Diagnosis Date Noted   Breast cyst, right 09/27/2022   Chronic left hip pain 11/20/2020   Need for shingles vaccine 11/20/2020   Stress 11/16/2020   Chronic right hip pain 11/16/2020   Osteopenia 10/12/2020   Cyst of cervix 09/05/2017   Thoracic degenerative disc disease 09/05/2017   Acute right-sided low back pain without sciatica 09/03/2017   Mid back pain 09/03/2017   Left lower quadrant pain 09/03/2017   Moderate episode of recurrent major depressive disorder (HCC) 09/02/2017   Heart palpitations 09/01/2016   Osteoporosis, 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   Depression 05/24/2015   DDD (degenerative disc disease), lumbar 05/24/2015   Mixed hyperlipidemia 05/24/2015   Migraine without aura and with status migrainosus, not intractable 05/24/2015   Post-menopausal 05/24/2015   Family history of heart attack 05/24/2015   Osteoporosis 05/24/2015   Past Medical History:  Diagnosis Date   Hyperlipidemia    Past  Surgical History:  Procedure Laterality Date   CESAREAN SECTION     Family History  Problem Relation Age of Onset   Heart attack Father    Cancer Brother    No Known Allergies    Patient Care Team: Nolene Ebbs as PCP - General (Family Medicine)   Outpatient Medications Prior to Visit  Medication Sig   AMBULATORY NON FORMULARY MEDICATION Shingles vaccine   APPLE CIDER VINEGAR PO Take by mouth daily.   Calcium 500 MG tablet Take 600 mg by mouth.   CINNAMON PO Take by mouth daily.   clobetasol cream (TEMOVATE) 0.05 % Apply 1 application topically 2 (two) times daily.   denosumab (PROLIA) 60 MG/ML SOLN injection Inject 60 mg into the skin every 6 (six) months. Administer in upper arm, thigh, or abdomen   lovastatin (MEVACOR) 20 MG tablet Take 1 tablet (20 mg total) by mouth at bedtime.   Magnesium 100 MG CAPS Take by mouth.   meloxicam (MOBIC) 15 MG tablet Take 1 tablet (15 mg total) by mouth daily.   Multiple Vitamin tablet Take 1 tablet by mouth daily.   verapamil (CALAN) 80 MG tablet Take 1 tablet (80 mg total) by mouth 2 (two) times daily.   Zinc Sulfate (ZINC 15 PO) Take by mouth.   No facility-administered medications prior to visit.    ROS  Objective:     BP (!) 145/91   Pulse 90   Ht 5\' 2"  (1.575 m)   Wt 143 lb (64.9 kg)   SpO2 99%   BMI 26.16 kg/m  BP Readings from Last 3 Encounters:  11/18/22 (!) 145/91  08/28/22 121/71  03/15/22 124/82   Wt Readings from Last 3 Encounters:  11/18/22 143 lb (64.9 kg)  03/15/22 140 lb 9.6 oz (63.8 kg)  02/19/22 140 lb (63.5 kg)      Physical Exam  BP (!) 145/91   Pulse 90   Ht 5\' 2"  (1.575 m)   Wt 143 lb (64.9 kg)   SpO2 99%   BMI 26.16 kg/m   General Appearance:    Alert, cooperative, no distress, appears stated age  Head:    Normocephalic, without obvious abnormality, atraumatic  Eyes:    PERRL, conjunctiva/corneas clear, EOM's intact, fundi    benign, both eyes  Ears:    Normal TM's  and external ear canals, both ears  Nose:   Nares normal, septum midline, mucosa normal, no drainage    or sinus tenderness  Throat:   Lips, mucosa, and tongue normal; teeth and gums normal  Neck:   Supple, symmetrical, trachea midline, no adenopathy;    thyroid:  no enlargement/tenderness/nodules; no carotid   bruit or JVD  Back:     Symmetric, no curvature, ROM normal, no CVA tenderness  Lungs:     Clear to auscultation bilaterally, respirations unlabored  Chest Wall:    No tenderness or deformity   Heart:    Regular rate and rhythm, S1 and S2 normal, no murmur, rub   or gallop     Abdomen:     Soft, non-tender, bowel sounds active all four quadrants,    no masses, no organomegaly        Extremities:   Extremities normal, atraumatic, no cyanosis or edema  Pulses:   2+ and symmetric all extremities  Skin:   Skin color, texture, turgor normal, no rashes or lesions  Lymph nodes:   Cervical, supraclavicular, and axillary nodes normal  Neurologic:   CNII-XII intact, normal strength, sensation and reflexes    throughout       Assessment & Plan:    Routine Health Maintenance and Physical Exam  Immunization History  Administered Date(s) Administered   Fluad Quad(high Dose 65+) 10/14/2018, 11/16/2019, 11/16/2020, 11/16/2021   Influenza,inj,Quad PF,6+ Mos 09/02/2017   Influenza-Unspecified 09/02/2017, 10/14/2018, 11/16/2019   PFIZER(Purple Top)SARS-COV-2 Vaccination 03/05/2019, 03/31/2019   Pneumococcal Conjugate-13 08/28/2015   Pneumococcal Polysaccharide-23 09/02/2017   Tdap 03/07/2016    Health Maintenance  Topic Date Due   Medicare Annual Wellness (AWV)  10/11/2021   DEXA SCAN  10/12/2022   COVID-19 Vaccine (3 - Pfizer risk series) 12/04/2022 (Originally 04/28/2019)   Zoster Vaccines- Shingrix (1 of 2) 02/18/2023 (Originally 04/20/1969)   INFLUENZA VACCINE  04/07/2023 (Originally 08/08/2022)   Fecal DNA (Cologuard)  06/16/2023   MAMMOGRAM  09/18/2023   DTaP/Tdap/Td (2 - Td or  Tdap) 03/08/2026   Pneumonia Vaccine 77+ Years old  Completed   Hepatitis C Screening  Completed   HPV VACCINES  Aged Out    Discussed health benefits of physical activity, and encouraged her to engage in regular exercise appropriate for her age and condition.  Kristen Morris was seen today for annual exam.  Diagnoses and all orders for this visit:  Routine physical examination -     DG Bone Density; Future  Osteoporosis, post-menopausal -  DG Bone Density; Future   .Kristen Kitchen Discussed 150 minutes of exercise a week.  Encouraged vitamin D 1000 units and Calcium 1300mg  or 4 servings of dairy a day.  Pt is on prolia and needs follow up bone density which was ordered today PHQ no concerns Fasting labs reviewed On statin, discussed increasing statin but patient does not want to do this Repeat in 1 year .Kristen KitchenMarland KitchenThe 10-year ASCVD risk score (Arnett DK, et al., 2019) is: 14.5%   Values used to calculate the score:     Age: 84 years     Sex: Female     Is Non-Hispanic African American: No     Diabetic: No     Tobacco smoker: No     Systolic Blood Pressure: 145 mmHg     Is BP treated: No     HDL Cholesterol: 54 mg/dL     Total Cholesterol: 184 mg/dL Cologuard UTD Mammogram UTD Pap not needed 2nd BP to goal Declined covid/flu/shingles vaccine Follow up in 1 year      Tandy Gaw, PA-C

## 2022-11-27 ENCOUNTER — Ambulatory Visit: Payer: Medicare HMO

## 2022-11-27 DIAGNOSIS — M810 Age-related osteoporosis without current pathological fracture: Secondary | ICD-10-CM

## 2022-11-27 DIAGNOSIS — Z13820 Encounter for screening for osteoporosis: Secondary | ICD-10-CM | POA: Diagnosis not present

## 2022-11-27 DIAGNOSIS — Z0000 Encounter for general adult medical examination without abnormal findings: Secondary | ICD-10-CM

## 2022-11-27 DIAGNOSIS — M818 Other osteoporosis without current pathological fracture: Secondary | ICD-10-CM

## 2022-11-27 DIAGNOSIS — M85832 Other specified disorders of bone density and structure, left forearm: Secondary | ICD-10-CM | POA: Diagnosis not present

## 2022-11-29 NOTE — Progress Notes (Signed)
Bone density has worsened a little from -2.1 to -2.4. make sure you are taking your vitamin D and calcium and getting prolia shot every 6 months.

## 2022-12-21 ENCOUNTER — Other Ambulatory Visit: Payer: Self-pay | Admitting: Physician Assistant

## 2022-12-21 DIAGNOSIS — Z0000 Encounter for general adult medical examination without abnormal findings: Secondary | ICD-10-CM

## 2022-12-21 DIAGNOSIS — G43001 Migraine without aura, not intractable, with status migrainosus: Secondary | ICD-10-CM

## 2022-12-25 MED ORDER — VERAPAMIL HCL 80 MG PO TABS: 80.0000 mg | ORAL_TABLET | Freq: Two times a day (BID) | ORAL | 1 refills | Status: DC

## 2022-12-25 NOTE — Telephone Encounter (Signed)
Copied from CRM [id number]. Topic: Clinical - Medication Refill >> Dec 25, 2022 11:10 AM Hector Shade B wrote: Most Recent Primary Care Visit:  Provider: Jomarie Longs  Department: [facility]  Visit Type: PHYSICAL  Date: 11/18/2022  Medication: verapamil (CALAN) 80 MG tablet  Has the patient contacted their pharmacy? Yes (Agent: If no, request that the patient contact the pharmacy for the refill. If patient does not wish to contact the pharmacy document the reason why and proceed with request.) (Agent: If yes, when and what did the pharmacy advise?)  Is this the correct pharmacy for this prescription? Yes If no, delete pharmacy and type the correct one.  This is the patient's preferred pharmacy:  [facility]) - [place], [place] - [address] [place] [address] Phone: [phone] Fax: [phone]  [id number] [place], [place] - [address] [place] [address] Phone: [phone] Fax: [phone]  [facility] - [place], [place] - [address] [place] [address] Phone: [phone] Fax: [phone]  [facility] [phone] [place], [place] - [address] [place]  [place] [address] Phone: [phone] Fax: [phone]   Has the prescription been filled recently? Yes  Is the patient out of the medication? No 4 more pills left  Has the patient been seen for an appointment in the last year OR does the patient have an upcoming appointment? Yes  Can we respond through MyChart? Yes  Agent: Please be advised that Rx refills may take up to 3 business days. We ask that you follow-up with your pharmacy.

## 2022-12-25 NOTE — Telephone Encounter (Signed)
 Medication sent to the pharmacy requested.

## 2023-01-07 ENCOUNTER — Other Ambulatory Visit: Payer: Self-pay | Admitting: Physician Assistant

## 2023-01-07 DIAGNOSIS — G43001 Migraine without aura, not intractable, with status migrainosus: Secondary | ICD-10-CM

## 2023-01-07 DIAGNOSIS — Z0000 Encounter for general adult medical examination without abnormal findings: Secondary | ICD-10-CM

## 2023-01-11 ENCOUNTER — Other Ambulatory Visit: Payer: Self-pay | Admitting: Physician Assistant

## 2023-01-11 DIAGNOSIS — Z0000 Encounter for general adult medical examination without abnormal findings: Secondary | ICD-10-CM

## 2023-01-11 DIAGNOSIS — G43001 Migraine without aura, not intractable, with status migrainosus: Secondary | ICD-10-CM

## 2023-03-20 DIAGNOSIS — M546 Pain in thoracic spine: Secondary | ICD-10-CM | POA: Diagnosis not present

## 2023-03-31 ENCOUNTER — Ambulatory Visit: Admitting: Physician Assistant

## 2023-04-14 ENCOUNTER — Other Ambulatory Visit: Payer: Self-pay | Admitting: Physician Assistant

## 2023-04-14 DIAGNOSIS — G43001 Migraine without aura, not intractable, with status migrainosus: Secondary | ICD-10-CM

## 2023-04-14 DIAGNOSIS — Z0000 Encounter for general adult medical examination without abnormal findings: Secondary | ICD-10-CM

## 2023-04-14 DIAGNOSIS — E782 Mixed hyperlipidemia: Secondary | ICD-10-CM

## 2023-05-13 DIAGNOSIS — H2513 Age-related nuclear cataract, bilateral: Secondary | ICD-10-CM | POA: Diagnosis not present

## 2023-05-13 DIAGNOSIS — H524 Presbyopia: Secondary | ICD-10-CM | POA: Diagnosis not present

## 2023-05-13 DIAGNOSIS — H5203 Hypermetropia, bilateral: Secondary | ICD-10-CM | POA: Diagnosis not present

## 2023-05-14 ENCOUNTER — Other Ambulatory Visit (HOSPITAL_COMMUNITY): Payer: Self-pay

## 2023-05-14 ENCOUNTER — Telehealth: Payer: Self-pay

## 2023-05-14 NOTE — Telephone Encounter (Signed)
 Prolia  VOB initiated via [email or web address]  Next Prolia  inj DUE: NOW   Pharmacy copay: $930.81

## 2023-05-14 NOTE — Telephone Encounter (Signed)
 Kristen Morris

## 2023-05-14 NOTE — Telephone Encounter (Signed)
 PA submitted via CMM for buy and bill. Key: Synetta Eves

## 2023-05-14 NOTE — Telephone Encounter (Signed)
 Pt ready for scheduling for PROLIA  on or after : 05/14/23  Option# 1: Buy/Bill (Office supplied medication)  Out-of-pocket cost due at time of clinic visit: $357  Number of injection/visits approved: 2  Primary: [employer] Prolia  co-insurance: 20% Admin fee co-insurance: 20%  Secondary: --- Prolia  co-insurance:  Admin fee co-insurance:   Medical Benefit Details: Date Benefits were checked: 05/14/23 Deductible: NO/ Coinsurance: 20%/ Admin Fee: 20%  Prior Auth: APPROVED PA# [id number] Expiration Date: 02/19/22-01/07/24  # of doses approved: 2 ----------------------------------------------------------------------- Option# 2- Med Obtained from pharmacy:  Pharmacy benefit: Copay $930.81 (Paid to pharmacy) Admin Fee: 20% (Pay at clinic)  Prior Auth: N/A PA# Expiration Date:   # of doses approved:   If patient wants fill through the pharmacy benefit please send prescription to: [employer], and include estimated need by date in rx notes. Pharmacy will ship medication directly to the office.  Patient NOT eligible for Prolia  Copay Card. Copay Card can make patient's cost as little as $25. Link to apply: [email or web address]://[email or web address].[email or web address]  ** This summary of benefits is an estimation of the patient's out-of-pocket cost. Exact cost may very based on individual plan coverage.

## 2023-06-26 ENCOUNTER — Telehealth: Payer: Self-pay

## 2023-06-26 DIAGNOSIS — M810 Age-related osteoporosis without current pathological fracture: Secondary | ICD-10-CM

## 2023-06-26 NOTE — Telephone Encounter (Signed)
 Labs pended for review. Last Prolia  injection was 08/28/22. Is this considered a re-start for Prolia ? Please advise, thanks.

## 2023-06-26 NOTE — Telephone Encounter (Signed)
 Copied from CRM [id number]. Topic: Clinical - Request for Lab/Test Order >> Jun 26, 2023 10:16 AM Magdalene School wrote: Reason for CRM: Patient stated that she gets Prolia  injection every 6 month and is a little bit behind on her next one, but typically she gets labs done prior to scheduling it. If labs are needed please place orders and contact patient for scheduling. Callback: [phone]

## 2023-06-27 NOTE — Telephone Encounter (Signed)
 Yes, restart. Lab signed.

## 2023-07-03 ENCOUNTER — Telehealth: Payer: Self-pay

## 2023-07-03 NOTE — Telephone Encounter (Signed)
 Copied from CRM [id number]. Topic: Clinical - Request for Lab/Test Order >> Jun 26, 2023 10:16 AM Mercer PEDLAR wrote: Reason for CRM: Patient stated that she gets Prolia  injection every 6 month and is a little bit behind on her next one, but typically she gets labs done prior to scheduling it. If labs are needed please place orders and contact patient for scheduling. Callback: [phone] >> Jul 03, 2023 12:48 PM Zane F wrote: Patient is calling for a status update on scheduling her prolia  injection appointment with a nurse and whether or not orders for her labs have been placed so the patient can schedule. Patient has not received an update and would like a callback possibly same day to provide her more information on this matter as she is in need of her prolia  injection. Patient is aware that she can come to the office to have labs completed but wants to see if the prolia  injection will be available as well.   Callback Number: [phone]

## 2023-07-03 NOTE — Telephone Encounter (Signed)
 Coverage breakdown in chart.  Lab orders in chart.  Has patient been informed of payment options or should I contact her ?

## 2023-07-03 NOTE — Telephone Encounter (Signed)
 Per other TE from Luke - spoke with patient. Informed that lab orders are in chart and she can just stop by the office at her convenience to have these drawn. Gave office visit lab hours to patient. Informed that once labs have resulted we will give her a call to schedule the prolia  appt

## 2023-07-03 NOTE — Telephone Encounter (Signed)
 Spoke with patient. Informed that lab orders are in chart and she can just stop by the office at her convenience to have these drawn. Gave office visit lab hours to patient. Informed that once labs have resulted we will give her a call to schedule the prolia  appt

## 2023-07-03 NOTE — Telephone Encounter (Signed)
 Once the patient gets scheduled for a NV to complete Prolia , I will link her appt to the Prolia  referral.

## 2023-07-07 DIAGNOSIS — M810 Age-related osteoporosis without current pathological fracture: Secondary | ICD-10-CM | POA: Diagnosis not present

## 2023-07-08 ENCOUNTER — Ambulatory Visit: Payer: Self-pay | Admitting: Family Medicine

## 2023-07-08 LAB — BMP8+EGFR
BUN/Creatinine Ratio: 23 (ref 12–28)
BUN: 23 mg/dL (ref 8–27)
CO2: 20 mmol/L (ref 20–29)
Calcium: 10.3 mg/dL (ref 8.7–10.3)
Chloride: 107 mmol/L — ABNORMAL HIGH (ref 96–106)
Creatinine, Ser: 1 mg/dL (ref 0.57–1.00)
Glucose: 88 mg/dL (ref 70–99)
Potassium: 4.8 mmol/L (ref 3.5–5.2)
Sodium: 144 mmol/L (ref 134–144)
eGFR: 59 mL/min/{1.73_m2} — ABNORMAL LOW (ref >59)

## 2023-07-08 NOTE — Progress Notes (Signed)
 Kidney function is stable.

## 2023-07-09 NOTE — Telephone Encounter (Signed)
 Patient is ready to be scheduled. Please inform her of her benefits. Once scheduled I will link her referral to the visit.

## 2023-07-09 NOTE — Telephone Encounter (Signed)
 Spoke with patient. Out of pocket cost for prolia  is too expensive. She states it has gone up $75 since her last injection. She is wanting to know if there is any other options for her for treatment. She states she has heard horror stories about bisphosphinates. I informed her that Vermell Bologna is out of the office until July 14, 2023 and will forward this message to her for review when she returns.

## 2023-07-14 NOTE — Telephone Encounter (Signed)
 Per Kim's TE on 07/09/23 - Spoke with patient. Out of pocket cost for prolia  is too expensive. She states it has gone up $75 since her last injection. She is wanting to know if there is any other options for her for treatment. She states she has heard horror stories about bisphosphinates. I informed her that Vermell Bologna is out of the office until July 14, 2023 and will forward this message to her for review when she returns.

## 2023-07-15 IMAGING — MG MM DIGITAL SCREENING BILAT W/ TOMO AND CAD
8 series · 9 of 24 positions shown · non-contrast
Comparison: Previous exam(s).

CLINICAL DATA: Screening.

EXAM:
DIGITAL SCREENING BILATERAL MAMMOGRAM WITH TOMOSYNTHESIS AND CAD
TECHNIQUE: Bilateral screening digital craniocaudal and mediolateral oblique
mammograms were obtained. Bilateral screening digital breast
tomosynthesis was performed. The images were evaluated with
computer-aided detection.

[L CC synth-2D]
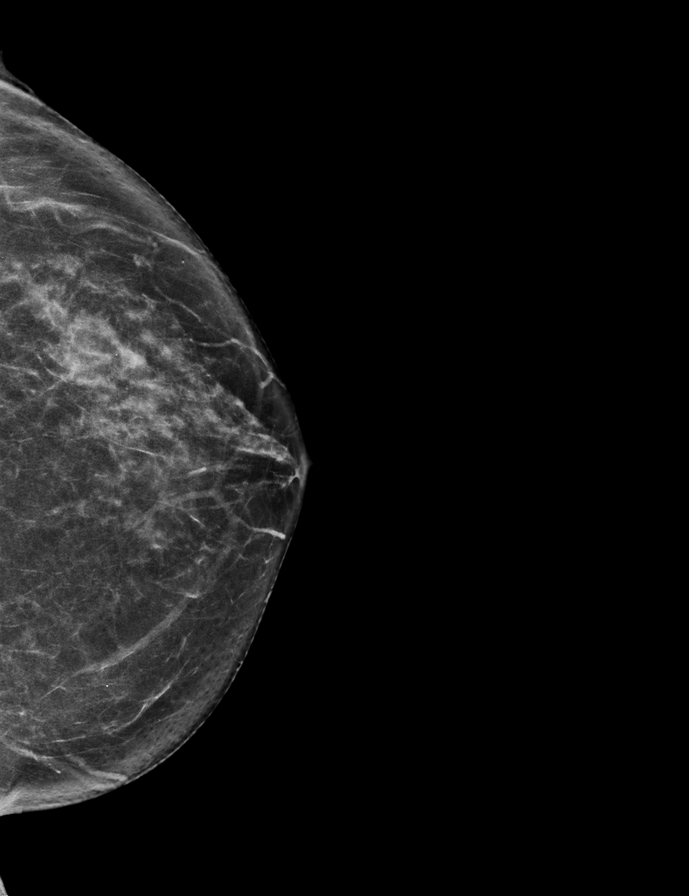

[R MLO synth-2D]
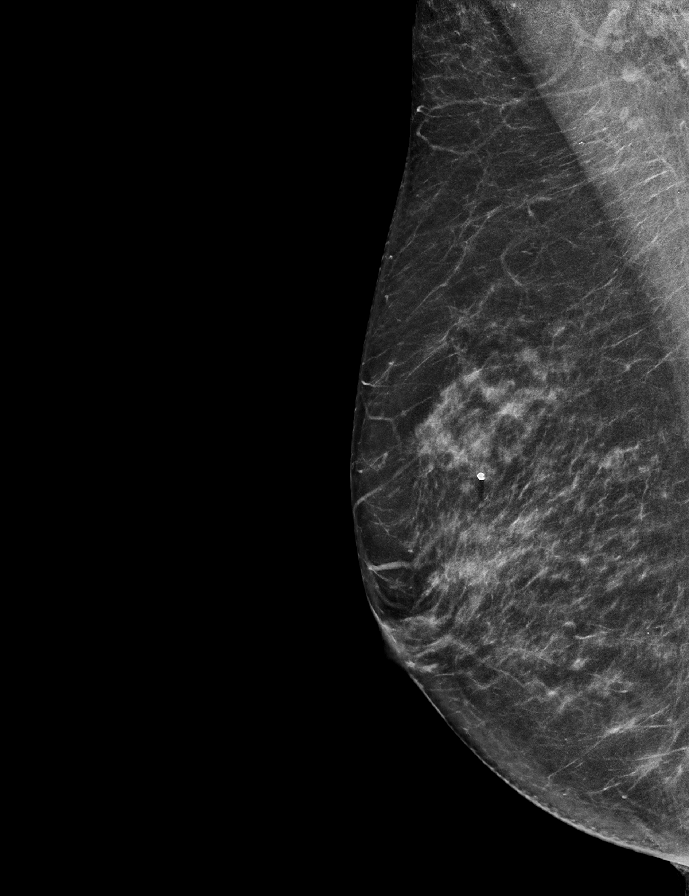

[L MLO synth-2D]
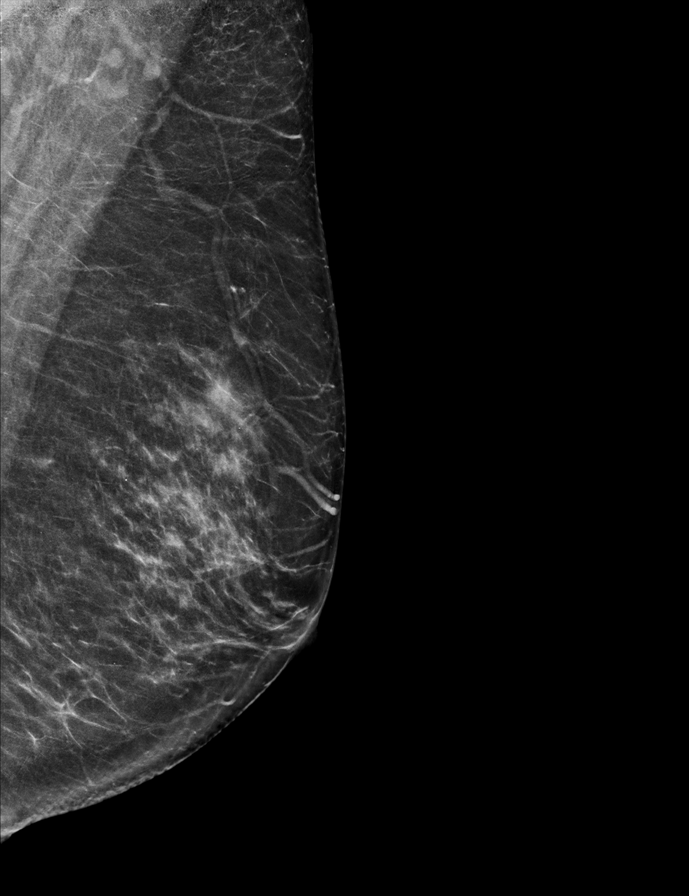

[R CC synth-2D]
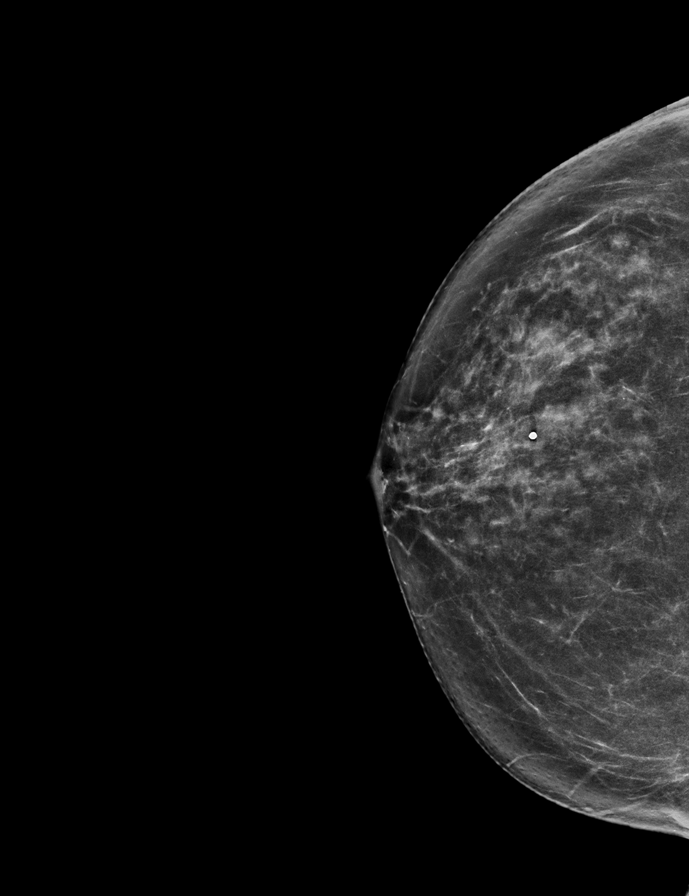

[R MLO tomo · 2 of 67 frames shown]
[frame 22/67]
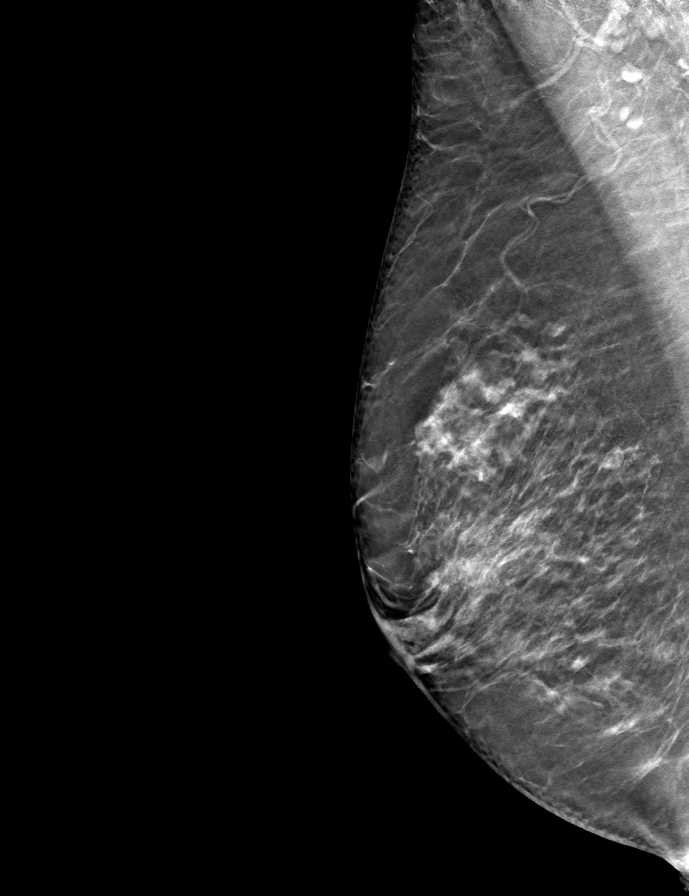
[frame 34/67]
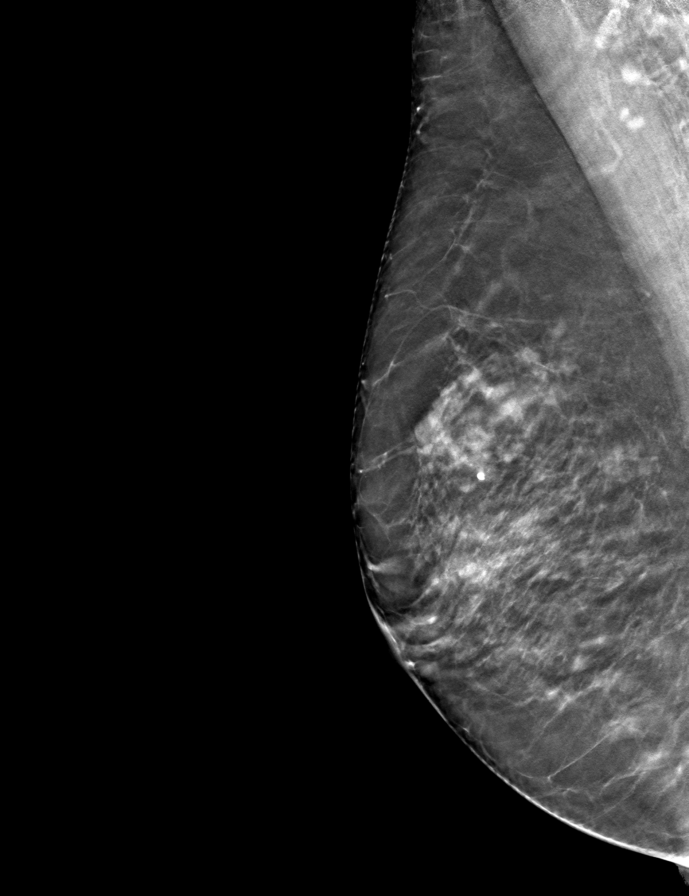

[R CC tomo · tomo slice 35/69.0]
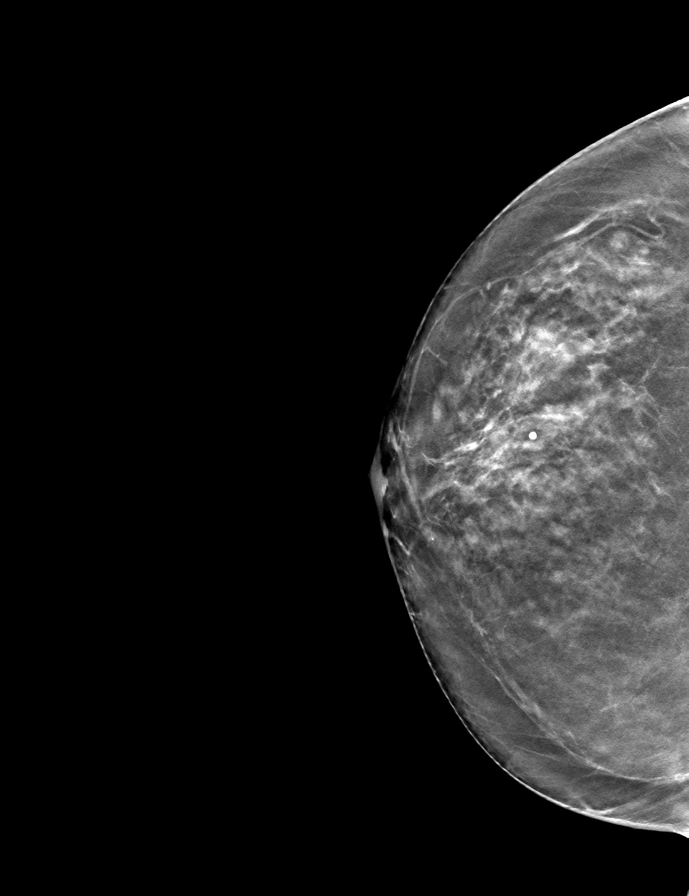

[L CC tomo · tomo slice 35/68.0]
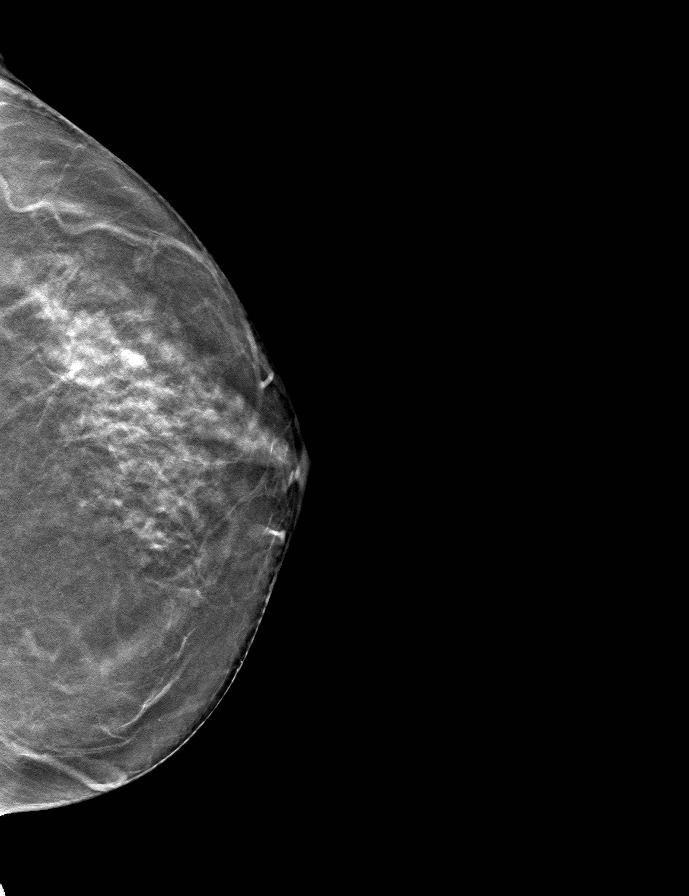

[L MLO tomo · tomo slice 35/69.0]
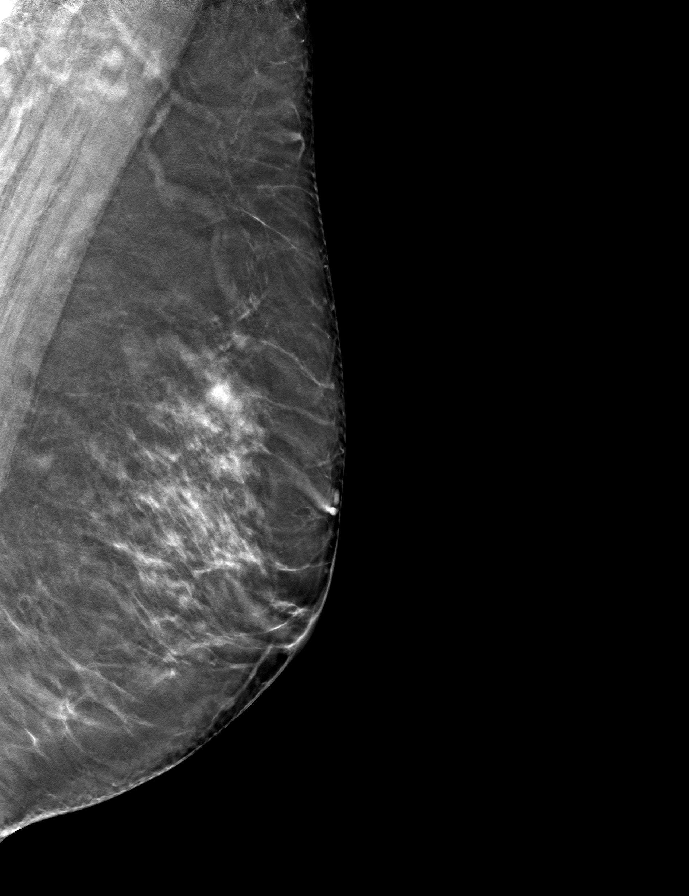

[9 of 24 positions shown; findings below may reference images not displayed]

ACR Breast Density Category b: There are scattered areas of
fibroglandular density.
FINDINGS: There are no findings suspicious for malignancy.
IMPRESSION: No mammographic evidence of malignancy. A result letter of this
screening mammogram will be mailed directly to the patient.

RECOMMENDATION:
Screening mammogram in one year. (Code:51-O-LD2)

BI-RADS CATEGORY  1: Negative.

## 2023-07-24 DIAGNOSIS — L814 Other melanin hyperpigmentation: Secondary | ICD-10-CM | POA: Diagnosis not present

## 2023-07-24 DIAGNOSIS — L821 Other seborrheic keratosis: Secondary | ICD-10-CM | POA: Diagnosis not present

## 2023-07-24 DIAGNOSIS — D485 Neoplasm of uncertain behavior of skin: Secondary | ICD-10-CM | POA: Diagnosis not present

## 2023-07-24 DIAGNOSIS — L578 Other skin changes due to chronic exposure to nonionizing radiation: Secondary | ICD-10-CM | POA: Diagnosis not present

## 2023-07-24 DIAGNOSIS — D225 Melanocytic nevi of trunk: Secondary | ICD-10-CM | POA: Diagnosis not present

## 2023-09-09 ENCOUNTER — Telehealth: Payer: Self-pay | Admitting: Physician Assistant

## 2023-09-09 NOTE — Telephone Encounter (Signed)
 Copied from CRM [id number]. Topic: Appointments - Scheduling Inquiry for Clinic >> Sep 09, 2023  9:54 AM Carmell SAUNDERS wrote: Reason for CRM: Pt needs to know where to start going for her mammograms. Not sure where to start please follow up [phone].

## 2023-09-15 ENCOUNTER — Encounter: Payer: Self-pay | Admitting: Physician Assistant

## 2023-09-15 DIAGNOSIS — Z1231 Encounter for screening mammogram for malignant neoplasm of breast: Secondary | ICD-10-CM

## 2023-09-26 NOTE — Telephone Encounter (Signed)
 This task has been completed as requested. Please review other TE for additional information. No further action needed.

## 2023-10-08 ENCOUNTER — Other Ambulatory Visit: Payer: Self-pay | Admitting: Physician Assistant

## 2023-10-08 DIAGNOSIS — Z0000 Encounter for general adult medical examination without abnormal findings: Secondary | ICD-10-CM

## 2023-10-08 DIAGNOSIS — G43001 Migraine without aura, not intractable, with status migrainosus: Secondary | ICD-10-CM

## 2023-10-16 ENCOUNTER — Encounter (HOSPITAL_BASED_OUTPATIENT_CLINIC_OR_DEPARTMENT_OTHER): Payer: Self-pay

## 2023-10-16 ENCOUNTER — Ambulatory Visit (HOSPITAL_BASED_OUTPATIENT_CLINIC_OR_DEPARTMENT_OTHER)
Admission: RE | Admit: 2023-10-16 | Discharge: 2023-10-16 | Disposition: A | Discharge status: Home / Self Care | Source: Ambulatory Visit | Attending: Physician Assistant | Admitting: Physician Assistant

## 2023-10-16 DIAGNOSIS — Z1231 Encounter for screening mammogram for malignant neoplasm of breast: Secondary | ICD-10-CM | POA: Insufficient documentation

## 2023-10-20 ENCOUNTER — Encounter: Payer: Self-pay | Admitting: Physician Assistant

## 2023-10-20 ENCOUNTER — Ambulatory Visit: Admitting: Physician Assistant

## 2023-10-20 ENCOUNTER — Ambulatory Visit: Payer: Self-pay | Admitting: Physician Assistant

## 2023-10-20 VITALS — BP 137/82 | HR 74 | Ht 62.0 in | Wt 135.0 lb

## 2023-10-20 DIAGNOSIS — Z636 Dependent relative needing care at home: Secondary | ICD-10-CM | POA: Diagnosis not present

## 2023-10-20 DIAGNOSIS — T7411XA Adult physical abuse, confirmed, initial encounter: Secondary | ICD-10-CM | POA: Insufficient documentation
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
## 2023-10-20 NOTE — Progress Notes (Signed)
 Normal mammogram. Follow up in 1 year.

## 2023-11-05 ENCOUNTER — Encounter: Payer: Self-pay | Admitting: Physician Assistant

## 2023-11-06 ENCOUNTER — Telehealth: Payer: Self-pay

## 2023-11-06 DIAGNOSIS — M818 Other osteoporosis without current pathological fracture: Secondary | ICD-10-CM

## 2023-11-06 DIAGNOSIS — Z0000 Encounter for general adult medical examination without abnormal findings: Secondary | ICD-10-CM

## 2023-11-06 DIAGNOSIS — Z131 Encounter for screening for diabetes mellitus: Secondary | ICD-10-CM

## 2023-11-06 DIAGNOSIS — E782 Mixed hyperlipidemia: Secondary | ICD-10-CM

## 2023-11-06 NOTE — Telephone Encounter (Signed)
 Copied from CRM [id number]. Topic: Clinical - Request for Lab/Test Order >> Nov 06, 2023  2:18 PM Anairis L wrote: Reason for CRM: Patient would like physical labs order placed.   Thank you.

## 2023-11-13 DIAGNOSIS — E782 Mixed hyperlipidemia: Secondary | ICD-10-CM | POA: Diagnosis not present

## 2023-11-13 DIAGNOSIS — M818 Other osteoporosis without current pathological fracture: Secondary | ICD-10-CM | POA: Diagnosis not present

## 2023-11-13 DIAGNOSIS — Z131 Encounter for screening for diabetes mellitus: Secondary | ICD-10-CM | POA: Diagnosis not present

## 2023-11-14 ENCOUNTER — Ambulatory Visit: Payer: Self-pay | Admitting: Physician Assistant

## 2023-11-14 LAB — CBC WITH DIFFERENTIAL/PLATELET
Basophils Absolute: 0.1 x10E3/uL (ref 0.0–0.2)
Basos: 1 %
EOS (ABSOLUTE): 0.2 x10E3/uL (ref 0.0–0.4)
Eos: 4 %
Hematocrit: 38.9 % (ref 34.0–46.6)
Hemoglobin: 12.5 g/dL (ref 11.1–15.9)
Immature Grans (Abs): 0 x10E3/uL (ref 0.0–0.1)
Immature Granulocytes: 0 %
Lymphocytes Absolute: 2.3 x10E3/uL (ref 0.7–3.1)
Lymphs: 41 %
MCH: 31.3 pg (ref 26.6–33.0)
MCHC: 32.1 g/dL (ref 31.5–35.7)
MCV: 97 fL (ref 79–97)
Monocytes Absolute: 0.5 x10E3/uL (ref 0.1–0.9)
Monocytes: 10 %
Neutrophils Absolute: 2.5 x10E3/uL (ref 1.4–7.0)
Neutrophils: 44 %
Platelets: 392 x10E3/uL (ref 150–450)
RBC: 4 x10E6/uL (ref 3.77–5.28)
RDW: 11.6 % — ABNORMAL LOW (ref 11.7–15.4)
WBC: 5.6 x10E3/uL (ref 3.4–10.8)

## 2023-11-14 LAB — CMP14+EGFR
ALT: 26 IU/L (ref 0–32)
AST: 29 IU/L (ref 0–40)
Albumin: 4.3 g/dL (ref 3.8–4.8)
Alkaline Phosphatase: 77 IU/L (ref 49–135)
BUN/Creatinine Ratio: 20 (ref 12–28)
BUN: 20 mg/dL (ref 8–27)
Bilirubin Total: 0.4 mg/dL (ref 0.0–1.2)
CO2: 23 mmol/L (ref 20–29)
Calcium: 10.2 mg/dL (ref 8.7–10.3)
Chloride: 106 mmol/L (ref 96–106)
Creatinine, Ser: 0.98 mg/dL (ref 0.57–1.00)
Globulin, Total: 2 g/dL (ref 1.5–4.5)
Glucose: 80 mg/dL (ref 70–99)
Potassium: 5 mmol/L (ref 3.5–5.2)
Sodium: 143 mmol/L (ref 134–144)
Total Protein: 6.3 g/dL (ref 6.0–8.5)
eGFR: 61 mL/min/1.73 (ref >59)

## 2023-11-14 LAB — TSH+FREE T4
Free T4: 0.85 ng/dL (ref 0.82–1.77)
TSH: 3.79 u[IU]/mL (ref 0.450–4.500)

## 2023-11-14 LAB — LIPID PANEL
Chol/HDL Ratio: 3.4 ratio (ref 0.0–4.4)
Cholesterol, Total: 185 mg/dL (ref 100–199)
HDL: 55 mg/dL (ref >39)
LDL Chol Calc (NIH): 112 mg/dL — ABNORMAL HIGH (ref 0–99)
Triglycerides: 98 mg/dL (ref 0–149)
VLDL Cholesterol Cal: 18 mg/dL (ref 5–40)

## 2023-11-14 LAB — B12 AND FOLATE PANEL
Folate: 17.7 ng/mL (ref >3.0)
Vitamin B-12: 1403 pg/mL — ABNORMAL HIGH (ref 232–1245)

## 2023-11-14 LAB — VITAMIN D 25 HYDROXY (VIT D DEFICIENCY, FRACTURES): Vit D, 25-Hydroxy: 38.9 ng/mL (ref 30.0–100.0)

## 2023-11-14 NOTE — Progress Notes (Signed)
 Kristen Morris,   Kidney function improved!  Vitamin d  normal. Make sure taking vitamin D  daily.  Thyroid  looks great.  LDL not to goal. Are you ok with increasing lovastatin  or switching to a more intense statin?  SABRASABRAThe 10-year ASCVD risk score (Arnett DK, et al., 2019) is: 14.5%   Values used to calculate the score:     Age: 73 years     Clincally relevant sex: Female     Is Non-Hispanic African American: No     Diabetic: No     Tobacco smoker: No     Systolic Blood Pressure: 137 mmHg     Is BP treated: No     HDL Cholesterol: 55 mg/dL     Total Cholesterol: 185 mg/dL

## 2023-11-19 ENCOUNTER — Encounter: Admitting: Physician Assistant

## 2023-11-24 ENCOUNTER — Ambulatory Visit (INDEPENDENT_AMBULATORY_CARE_PROVIDER_SITE_OTHER): Admitting: Physician Assistant

## 2023-11-24 ENCOUNTER — Encounter: Payer: Self-pay | Admitting: Physician Assistant

## 2023-11-24 VITALS — BP 134/82 | HR 80 | Ht 62.0 in | Wt 132.0 lb

## 2023-11-24 DIAGNOSIS — Z0000 Encounter for general adult medical examination without abnormal findings: Secondary | ICD-10-CM

## 2023-11-24 DIAGNOSIS — M545 Low back pain, unspecified: Secondary | ICD-10-CM | POA: Insufficient documentation

## 2023-11-24 DIAGNOSIS — M810 Age-related osteoporosis without current pathological fracture: Secondary | ICD-10-CM

## 2023-11-24 DIAGNOSIS — N182 Chronic kidney disease, stage 2 (mild): Secondary | ICD-10-CM | POA: Insufficient documentation

## 2023-11-24 DIAGNOSIS — E782 Mixed hyperlipidemia: Secondary | ICD-10-CM

## 2023-11-24 MED ORDER — DENOSUMAB 60 MG/ML ~~LOC~~ SOSY: 60.0000 mg | PREFILLED_SYRINGE | Freq: Once | SUBCUTANEOUS | 0 refills | Status: AC
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
## 2023-11-24 NOTE — Patient Instructions (Signed)
 Low Back Sprain or Strain Rehab Ask your health care provider which exercises are safe for you. Do exercises exactly as told by your health care provider and adjust them as directed. It is normal to feel mild stretching, pulling, tightness, or discomfort as you do these exercises. Stop right away if you feel sudden pain or your pain gets worse. Do not begin these exercises until told by your health care provider. Stretching and range-of-motion exercises These exercises warm up your muscles and joints and improve the movement and flexibility of your back. These exercises also help to relieve pain, numbness, and tingling. Lumbar rotation  Lie on your back on a firm bed or the floor with your knees bent. Straighten your arms out to your sides so each arm forms a 90-degree angle (right angle) with a side of your body. Slowly move (rotate) both of your knees to one side of your body until you feel a stretch in your lower back (lumbar). Try not to let your shoulders lift off the floor. Hold this position for __________ seconds. Tense your abdominal muscles and slowly move your knees back to the starting position. Repeat this exercise on the other side of your body. Repeat __________ times. Complete this exercise __________ times a day. Single knee to chest  Lie on your back on a firm bed or the floor with both legs straight. Bend one of your knees. Use your hands to move your knee up toward your chest until you feel a gentle stretch in your lower back and buttock. Hold your leg in this position by holding on to the front of your knee. Keep your other leg as straight as possible. Hold this position for __________ seconds. Slowly return to the starting position. Repeat with your other leg. Repeat __________ times. Complete this exercise __________ times a day. Prone extension on elbows  Lie on your abdomen on a firm bed or the floor (prone position). Prop yourself up on your elbows. Use your arms  to help lift your chest up until you feel a gentle stretch in your abdomen and your lower back. This will place some of your body weight on your elbows. If this is uncomfortable, try stacking pillows under your chest. Your hips should stay down, against the surface that you are lying on. Keep your hip and back muscles relaxed. Hold this position for __________ seconds. Slowly relax your upper body and return to the starting position. Repeat __________ times. Complete this exercise __________ times a day. Strengthening exercises These exercises build strength and endurance in your back. Endurance is the ability to use your muscles for a long time, even after they get tired. Pelvic tilt This exercise strengthens the muscles that lie deep in the abdomen. Lie on your back on a firm bed or the floor with your legs extended. Bend your knees so they are pointing toward the ceiling and your feet are flat on the floor. Tighten your lower abdominal muscles to press your lower back against the floor. This motion will tilt your pelvis so your tailbone points up toward the ceiling instead of pointing to your feet or the floor. To help with this exercise, you may place a small towel under your lower back and try to push your back into the towel. Hold this position for __________ seconds. Let your muscles relax completely before you repeat this exercise. Repeat __________ times. Complete this exercise __________ times a day. Alternating arm and leg raises  Get on your hands  and knees on a firm surface. If you are on a hard floor, you may want to use padding, such as an exercise mat, to cushion your knees. Line up your arms and legs. Your hands should be directly below your shoulders, and your knees should be directly below your hips. Lift your left leg behind you. At the same time, raise your right arm and straighten it in front of you. Do not lift your leg higher than your hip. Do not lift your arm higher  than your shoulder. Keep your abdominal and back muscles tight. Keep your hips facing the ground. Do not arch your back. Keep your balance carefully, and do not hold your breath. Hold this position for __________ seconds. Slowly return to the starting position. Repeat with your right leg and your left arm. Repeat __________ times. Complete this exercise __________ times a day. Abdominal set with straight leg raise  Lie on your back on a firm bed or the floor. Bend one of your knees and keep your other leg straight. Tense your abdominal muscles and lift your straight leg up, 4-6 inches (10-15 cm) off the ground. Keep your abdominal muscles tight and hold this position for __________ seconds. Do not hold your breath. Do not arch your back. Keep it flat against the ground. Keep your abdominal muscles tense as you slowly lower your leg back to the starting position. Repeat with your other leg. Repeat __________ times. Complete this exercise __________ times a day. Single leg lower with bent knees Lie on your back on a firm bed or the floor. Tense your abdominal muscles and lift your feet off the floor, one foot at a time, so your knees and hips are bent in 90-degree angles (right angles). Your knees should be over your hips and your lower legs should be parallel to the floor. Keeping your abdominal muscles tense and your knee bent, slowly lower one of your legs so your toe touches the ground. Lift your leg back up to return to the starting position. Do not hold your breath. Do not let your back arch. Keep your back flat against the ground. Repeat with your other leg. Repeat __________ times. Complete this exercise __________ times a day. Posture and body mechanics Good posture and healthy body mechanics can help to relieve stress in your body's tissues and joints. Body mechanics refers to the movements and positions of your body while you do your daily activities. Posture is part of body  mechanics. Good posture means: Your spine is in its natural S-curve position (neutral). Your shoulders are pulled back slightly. Your head is not tipped forward (neutral). Follow these guidelines to improve your posture and body mechanics in your everyday activities. Standing  When standing, keep your spine neutral and your feet about hip-width apart. Keep a slight bend in your knees. Your ears, shoulders, and hips should line up. When you do a task in which you stand in one place for a long time, place one foot up on a stable object that is 2-4 inches (5-10 cm) high, such as a footstool. This helps keep your spine neutral. Sitting  When sitting, keep your spine neutral and keep your feet flat on the floor. Use a footrest, if necessary, and keep your thighs parallel to the floor. Avoid rounding your shoulders, and avoid tilting your head forward. When working at a desk or a computer, keep your desk at a height where your hands are slightly lower than your elbows. Slide your  chair under your desk so you are close enough to maintain good posture. When working at a computer, place your monitor at a height where you are looking straight ahead and you do not have to tilt your head forward or downward to look at the screen. Resting When lying down and resting, avoid positions that are most painful for you. If you have pain with activities such as sitting, bending, stooping, or squatting, lie in a position in which your body does not bend very much. For example, avoid curling up on your side with your arms and knees near your chest (fetal position). If you have pain with activities such as standing for a long time or reaching with your arms, lie with your spine in a neutral position and bend your knees slightly. Try the following positions: Lying on your side with a pillow between your knees. Lying on your back with a pillow under your knees. Lifting  When lifting objects, keep your feet at least  shoulder-width apart and tighten your abdominal muscles. Bend your knees and hips and keep your spine neutral. It is important to lift using the strength of your legs, not your back. Do not lock your knees straight out. Always ask for help to lift heavy or awkward objects. This information is not intended to replace advice given to you by your health care provider. Make sure you discuss any questions you have with your health care provider. Document Revised: 04/29/2022 Document Reviewed: 03/13/2020 Elsevier Patient Education  2024 [employer]  Health Maintenance After Age 28 After age 7, you are at a higher risk for certain long-term diseases and infections as well as injuries from falls. Falls are a major cause of broken bones and head injuries in people who are older than age 6. Getting regular preventive care can help to keep you healthy and well. Preventive care includes getting regular testing and making lifestyle changes as recommended by your health care provider. Talk with your health care provider about: Which screenings and tests you should have. A screening is a test that checks for a disease when you have no symptoms. A diet and exercise plan that is right for you. What should I know about screenings and tests to prevent falls? Screening and testing are the best ways to find a health problem early. Early diagnosis and treatment give you the best chance of managing medical conditions that are common after age 84. Certain conditions and lifestyle choices may make you more likely to have a fall. Your health care provider may recommend: Regular vision checks. Poor vision and conditions such as cataracts can make you more likely to have a fall. If you wear glasses, make sure to get your prescription updated if your vision changes. Medicine review. Work with your health care provider to regularly review all of the medicines you are taking, including over-the-counter medicines. Ask your  health care provider about any side effects that may make you more likely to have a fall. Tell your health care provider if any medicines that you take make you feel dizzy or sleepy. Strength and balance checks. Your health care provider may recommend certain tests to check your strength and balance while standing, walking, or changing positions. Foot health exam. Foot pain and numbness, as well as not wearing proper footwear, can make you more likely to have a fall. Screenings, including: Osteoporosis screening. Osteoporosis is a condition that causes the bones to get weaker and break more easily. Blood pressure screening. Blood  pressure changes and medicines to control blood pressure can make you feel dizzy. Depression screening. You may be more likely to have a fall if you have a fear of falling, feel depressed, or feel unable to do activities that you used to do. Alcohol use screening. Using too much alcohol can affect your balance and may make you more likely to have a fall. Follow these instructions at home: Lifestyle Do not drink alcohol if: Your health care provider tells you not to drink. If you drink alcohol: Limit how much you have to: 0-1 drink a day for women. 0-2 drinks a day for men. Know how much alcohol is in your drink. In the [place] one drink equals one 12 oz bottle of beer (355 mL), one 5 oz glass of wine (148 mL), or one 1 oz glass of hard liquor (44 mL). Do not use any products that contain nicotine or tobacco. These products include cigarettes, chewing tobacco, and vaping devices, such as e-cigarettes. If you need help quitting, ask your health care provider. Activity  Follow a regular exercise program to stay fit. This will help you maintain your balance. Ask your health care provider what types of exercise are appropriate for you. If you need a cane or walker, use it as recommended by your health care provider. Wear supportive shoes that have nonskid  soles. Safety  Remove any tripping hazards, such as rugs, cords, and clutter. Install safety equipment such as grab bars in bathrooms and safety rails on stairs. Keep rooms and walkways well-lit. General instructions Talk with your health care provider about your risks for falling. Tell your health care provider if: You fall. Be sure to tell your health care provider about all falls, even ones that seem minor. You feel dizzy, tiredness (fatigue), or off-balance. Take over-the-counter and prescription medicines only as told by your health care provider. These include supplements. Eat a healthy diet and maintain a healthy weight. A healthy diet includes low-fat dairy products, low-fat (lean) meats, and fiber from whole grains, beans, and lots of fruits and vegetables. Stay current with your vaccines. Schedule regular health, dental, and eye exams. Summary Having a healthy lifestyle and getting preventive care can help to protect your health and wellness after age 43. Screening and testing are the best way to find a health problem early and help you avoid having a fall. Early diagnosis and treatment give you the best chance for managing medical conditions that are more common for people who are older than age 64. Falls are a major cause of broken bones and head injuries in people who are older than age 72. Take precautions to prevent a fall at home. Work with your health care provider to learn what changes you can make to improve your health and wellness and to prevent falls. This information is not intended to replace advice given to you by your health care provider. Make sure you discuss any questions you have with your health care provider. Document Revised: 05/15/2020 Document Reviewed: 05/15/2020 Elsevier Patient Education  2024 [employer].

## 2024-05-24 ENCOUNTER — Ambulatory Visit: Admitting: Physician Assistant

## 2024-11-23 ENCOUNTER — Encounter: Admitting: Physician Assistant
